# Patient Record
Sex: Female | Born: 1970 | Race: White | Hispanic: Yes | Marital: Married | Smoking: Current every day smoker
Health system: Southern US, Community
[De-identification: ages and names within clinical notes are randomized; demographics above are authoritative.]

## PROBLEM LIST (undated history)

## (undated) DIAGNOSIS — J45909 Unspecified asthma, uncomplicated: Secondary | ICD-10-CM

## (undated) DIAGNOSIS — Z91030 Bee allergy status: Secondary | ICD-10-CM

## (undated) DIAGNOSIS — J329 Chronic sinusitis, unspecified: Secondary | ICD-10-CM

## (undated) DIAGNOSIS — J309 Allergic rhinitis, unspecified: Secondary | ICD-10-CM

## (undated) DIAGNOSIS — D509 Iron deficiency anemia, unspecified: Secondary | ICD-10-CM

## (undated) DIAGNOSIS — F329 Major depressive disorder, single episode, unspecified: Secondary | ICD-10-CM

## (undated) HISTORY — DX: Chronic sinusitis, unspecified: J32.9

## (undated) HISTORY — DX: Major depressive disorder, single episode, unspecified: F32.9

## (undated) HISTORY — PX: UMBILICAL HERNIA REPAIR: SHX196

## (undated) HISTORY — DX: Unspecified asthma, uncomplicated: J45.909

## (undated) HISTORY — DX: Iron deficiency anemia, unspecified: D50.9

## (undated) HISTORY — DX: Bee allergy status: Z91.030

## (undated) HISTORY — DX: Allergic rhinitis, unspecified: J30.9

---

## 2003-11-27 ENCOUNTER — Ambulatory Visit: Payer: Self-pay | Admitting: Internal Medicine

## 2004-01-31 ENCOUNTER — Ambulatory Visit: Payer: Self-pay | Admitting: Internal Medicine

## 2004-01-31 ENCOUNTER — Emergency Department (HOSPITAL_COMMUNITY): Admission: EM | Admit: 2004-01-31 | Discharge: 2004-01-31 | Payer: Self-pay | Admitting: Emergency Medicine

## 2004-04-02 ENCOUNTER — Ambulatory Visit (HOSPITAL_COMMUNITY): Admission: RE | Admit: 2004-04-02 | Discharge: 2004-04-02 | Payer: Self-pay | Admitting: Surgery

## 2004-04-02 ENCOUNTER — Encounter (INDEPENDENT_AMBULATORY_CARE_PROVIDER_SITE_OTHER): Payer: Self-pay | Admitting: *Deleted

## 2004-04-02 ENCOUNTER — Ambulatory Visit (HOSPITAL_BASED_OUTPATIENT_CLINIC_OR_DEPARTMENT_OTHER): Admission: RE | Admit: 2004-04-02 | Discharge: 2004-04-02 | Payer: Self-pay | Admitting: Surgery

## 2004-06-05 ENCOUNTER — Ambulatory Visit: Payer: Self-pay | Admitting: Internal Medicine

## 2004-08-18 ENCOUNTER — Ambulatory Visit: Payer: Self-pay | Admitting: Internal Medicine

## 2005-02-10 ENCOUNTER — Other Ambulatory Visit: Admission: RE | Admit: 2005-02-10 | Discharge: 2005-02-10 | Payer: Self-pay | Admitting: Obstetrics & Gynecology

## 2005-03-18 ENCOUNTER — Ambulatory Visit (HOSPITAL_COMMUNITY): Admission: RE | Admit: 2005-03-18 | Discharge: 2005-03-18 | Payer: Self-pay | Admitting: Obstetrics & Gynecology

## 2005-03-18 ENCOUNTER — Encounter (INDEPENDENT_AMBULATORY_CARE_PROVIDER_SITE_OTHER): Payer: Self-pay | Admitting: Specialist

## 2005-07-01 ENCOUNTER — Encounter: Admission: RE | Admit: 2005-07-01 | Discharge: 2005-07-01 | Payer: Self-pay | Admitting: Obstetrics & Gynecology

## 2005-07-02 ENCOUNTER — Encounter: Admission: RE | Admit: 2005-07-02 | Discharge: 2005-07-02 | Payer: Self-pay | Admitting: Obstetrics & Gynecology

## 2006-01-19 ENCOUNTER — Ambulatory Visit: Payer: Self-pay | Admitting: Internal Medicine

## 2006-05-20 ENCOUNTER — Ambulatory Visit: Payer: Self-pay | Admitting: Internal Medicine

## 2006-07-26 ENCOUNTER — Ambulatory Visit: Payer: Self-pay | Admitting: Internal Medicine

## 2006-11-07 ENCOUNTER — Encounter: Payer: Self-pay | Admitting: Internal Medicine

## 2006-11-07 DIAGNOSIS — J309 Allergic rhinitis, unspecified: Secondary | ICD-10-CM | POA: Insufficient documentation

## 2006-11-07 DIAGNOSIS — H0019 Chalazion unspecified eye, unspecified eyelid: Secondary | ICD-10-CM | POA: Insufficient documentation

## 2006-11-07 DIAGNOSIS — L723 Sebaceous cyst: Secondary | ICD-10-CM | POA: Insufficient documentation

## 2006-12-27 ENCOUNTER — Ambulatory Visit: Payer: Self-pay | Admitting: Internal Medicine

## 2006-12-27 DIAGNOSIS — F4321 Adjustment disorder with depressed mood: Secondary | ICD-10-CM | POA: Insufficient documentation

## 2006-12-27 DIAGNOSIS — J45909 Unspecified asthma, uncomplicated: Secondary | ICD-10-CM | POA: Insufficient documentation

## 2006-12-27 DIAGNOSIS — F3289 Other specified depressive episodes: Secondary | ICD-10-CM

## 2006-12-27 DIAGNOSIS — F329 Major depressive disorder, single episode, unspecified: Secondary | ICD-10-CM

## 2006-12-27 DIAGNOSIS — D509 Iron deficiency anemia, unspecified: Secondary | ICD-10-CM

## 2006-12-27 DIAGNOSIS — J019 Acute sinusitis, unspecified: Secondary | ICD-10-CM | POA: Insufficient documentation

## 2006-12-27 HISTORY — DX: Other specified depressive episodes: F32.89

## 2006-12-27 HISTORY — DX: Unspecified asthma, uncomplicated: J45.909

## 2006-12-27 HISTORY — DX: Major depressive disorder, single episode, unspecified: F32.9

## 2006-12-27 HISTORY — DX: Iron deficiency anemia, unspecified: D50.9

## 2007-06-29 ENCOUNTER — Ambulatory Visit: Payer: Self-pay | Admitting: Internal Medicine

## 2007-06-29 DIAGNOSIS — F17200 Nicotine dependence, unspecified, uncomplicated: Secondary | ICD-10-CM | POA: Insufficient documentation

## 2007-06-29 DIAGNOSIS — J069 Acute upper respiratory infection, unspecified: Secondary | ICD-10-CM | POA: Insufficient documentation

## 2007-06-29 LAB — CONVERTED CEMR LAB: Rapid Strep: NEGATIVE

## 2007-08-30 ENCOUNTER — Ambulatory Visit: Payer: Self-pay | Admitting: Internal Medicine

## 2007-08-30 DIAGNOSIS — R42 Dizziness and giddiness: Secondary | ICD-10-CM | POA: Insufficient documentation

## 2007-08-30 DIAGNOSIS — G4700 Insomnia, unspecified: Secondary | ICD-10-CM | POA: Insufficient documentation

## 2007-09-14 ENCOUNTER — Encounter: Payer: Self-pay | Admitting: Internal Medicine

## 2007-09-22 ENCOUNTER — Telehealth (INDEPENDENT_AMBULATORY_CARE_PROVIDER_SITE_OTHER): Payer: Self-pay | Admitting: *Deleted

## 2007-10-25 ENCOUNTER — Telehealth: Payer: Self-pay | Admitting: Internal Medicine

## 2008-07-26 ENCOUNTER — Ambulatory Visit: Payer: Self-pay | Admitting: Internal Medicine

## 2008-07-30 ENCOUNTER — Encounter (INDEPENDENT_AMBULATORY_CARE_PROVIDER_SITE_OTHER): Payer: Self-pay | Admitting: *Deleted

## 2008-07-30 ENCOUNTER — Telehealth: Payer: Self-pay | Admitting: Internal Medicine

## 2008-08-14 ENCOUNTER — Telehealth: Payer: Self-pay | Admitting: Internal Medicine

## 2008-08-15 ENCOUNTER — Encounter: Payer: Self-pay | Admitting: Internal Medicine

## 2008-09-09 ENCOUNTER — Telehealth: Payer: Self-pay | Admitting: Internal Medicine

## 2008-09-11 ENCOUNTER — Ambulatory Visit: Payer: Self-pay | Admitting: Internal Medicine

## 2008-09-11 DIAGNOSIS — H6690 Otitis media, unspecified, unspecified ear: Secondary | ICD-10-CM | POA: Insufficient documentation

## 2008-09-11 DIAGNOSIS — J329 Chronic sinusitis, unspecified: Secondary | ICD-10-CM

## 2008-09-11 HISTORY — DX: Chronic sinusitis, unspecified: J32.9

## 2008-11-19 ENCOUNTER — Ambulatory Visit: Payer: Self-pay | Admitting: Internal Medicine

## 2008-11-19 DIAGNOSIS — K921 Melena: Secondary | ICD-10-CM | POA: Insufficient documentation

## 2008-11-21 ENCOUNTER — Encounter: Payer: Self-pay | Admitting: Internal Medicine

## 2008-11-21 LAB — CONVERTED CEMR LAB
AST: 53 units/L — ABNORMAL HIGH (ref 0–37)
Albumin: 4.1 g/dL (ref 3.5–5.2)
Alkaline Phosphatase: 57 units/L (ref 39–117)
Basophils Absolute: 0.1 10*3/uL (ref 0.0–0.1)
CO2: 25 meq/L (ref 19–32)
Calcium: 8.6 mg/dL (ref 8.4–10.5)
Cholesterol: 275 mg/dL — ABNORMAL HIGH (ref 0–200)
Creatinine, Ser: 0.8 mg/dL (ref 0.4–1.2)
Direct LDL: 169.8 mg/dL
GFR calc non Af Amer: 84.41 mL/min (ref >60)
HCT: 39.4 % (ref 36.0–46.0)
Lymphocytes Relative: 29.9 % (ref 12.0–46.0)
Lymphs Abs: 1.6 10*3/uL (ref 0.7–4.0)
Monocytes Absolute: 0.4 10*3/uL (ref 0.1–1.0)
Neutrophils Relative %: 58.4 % (ref 43.0–77.0)
Potassium: 4.2 meq/L (ref 3.5–5.1)
RDW: 13.2 % (ref 11.5–14.6)
Sodium: 140 meq/L (ref 135–145)
Total Bilirubin: 0.4 mg/dL (ref 0.3–1.2)
Total CHOL/HDL Ratio: 3

## 2008-11-22 LAB — CONVERTED CEMR LAB
HCV Ab: NEGATIVE
Hep A IgM: NEGATIVE
Hep B C IgM: NEGATIVE
Hepatitis B Surface Ag: NEGATIVE

## 2009-03-20 ENCOUNTER — Encounter: Payer: Self-pay | Admitting: Internal Medicine

## 2010-02-17 NOTE — Letter (Signed)
Summary: Referral - not able to see patient  [facility]  [address] [place], [place] [address]   Phone: [phone]  Fax: [phone]    March 20, 2009   Dr. Oliver Barre, M.D. [address] [place], [place] [address]   Re:   Shannon [name] DOB:  1970/03/22 MRN:   [id number]    Dear Dr. Jonny Ruiz:  Thank you for your kind referral of the above patient.  We have attempted to schedule the recommended procedure Screening Colonoscopy but have not been able to schedule because:   X  The patient was not available by phone and/or has not returned our calls.  ___ The patient declined to schedule the procedure at this time.  We appreciate the referral and hope that we will have the opportunity to treat this patient in the future.    Sincerely,    [facility] [phone]

## 2010-06-05 NOTE — Consult Note (Signed)
[name], [name]              ACCOUNT NO.:  [id number]   MEDICAL RECORD NO.:  [id number]          PATIENT TYPE:  EMS   LOCATION:  ED                           FACILITY[facility]  [facility]   PHYSICIAN:  Sandria Bales. Ezzard Standing, M.D.  DATE OF BIRTH:  11-29-1970   DATE OF CONSULTATION:  DATE OF DISCHARGE:                                   CONSULTATION   [name] is a 40 year old black female who is a patient of Dr. Oliver [name]  who has noticed a lump at the bottom of her neck for about a couple of  years; however, over the last 4-5 days, this lump has increased in size with  increasing tenderness.  She saw Dr. Jonny [name] in his office today, who felt this  was infected, put her on Avelox and asked me to see her.   Patient has no history of skin conditions or skin problems.   PAST MEDICAL HISTORY:  She has no allergies.  Is on no medications.  She had  a tetanus shot within the last five years.  She denies any history of  pulmonary disease.   REVIEW OF SYSTEMS:  PULMONARY:  Does not smoke cigarettes.  CARDIAC:  No heart disease or chest pain.  GASTROINTESTINAL:  No history of peptic ulcer disease or liver disease.  UROLOGIC:  No kidney stones or kidney infections.  She is accompanied by her  boyfriend, Shannon [name], in the emergency room.   PHYSICAL EXAMINATION:  VITAL SIGNS:  Temperature 96.7, blood pressure  137/89, pulse 82, respirations 20.  GENERAL:  She is a pleasant black female.  HEENT:  Unremarkable.  NECK:  At the base of her neck, she has about a 2 cm infected cyst.  This is  right at the suprasternal notch.  She has no lymphadenopathy.  I really  cannot palpate her thyroid very well.  LUNGS:  Clear to auscultation.  HEART:  Regular rate and rhythm without murmur or rub.   IMPRESSION:  Diagnosis:   Infected sebaceous cyst at the base of her neck.   I discussed with the patient that I think the best way for me to take care  of this is for me to lance this today and then bring her back in 6-8  weeks  to excise the residual sac.   Will plan to do that today.  She already has Avelox as an antibiotic, which  she will carry out for at least 7 days.  I will give her a V-pack, or  Vicodin pack, when she leaves the emergency room.  She will see me back in  one week after the drainage.     Davi   DHN/MEDQ  D:  01/31/2004  T:  01/31/2004  Job:  [id number]   cc:   Corwin Levins, M.D. [facility]

## 2010-06-05 NOTE — Op Note (Signed)
NAMEJESELLE, HISER              ACCOUNT NO.:  [id number]   MEDICAL RECORD NO.:  [id number]          PATIENT TYPE:  AMB   LOCATION:  SDC                           FACILITY:  [facility]   PHYSICIAN:  Freddy Finner, M.D.   DATE OF BIRTH:  12/10/1970   DATE OF PROCEDURE:  03/18/2005  DATE OF DISCHARGE:                                 OPERATIVE REPORT   PREOPERATIVE DIAGNOSES:  1.  Fibroma of right ovary with calcification, endometrial thickening.  2.  History of menorrhagia, heavy regular menses of nine to 11 days' flow      duration.   POSTOPERATIVE DIAGNOSES:  1.  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  2.  Possible small fibroma of superior pole of right ovary.  3.  Two small fibromas of left ovary associated with the utero-ovarian      ligament.  4.  Endometrial thickening.  5.  Endocervical polyp.  6.  Intra-abdominal adhesions.   OPERATIVE PROCEDURES:  1.  Laparoscopy.  2.  Fulguration and an excision  of small fibromas of left utero-ovarian      ligament and right superior pole of ovary.  3.  Fulguration and resection of a 1.5-cm fibroma of right fundus.  4.  Hysteroscopy.  5.  D&C.  6.  Resection of endocervical polyp.   ESTIMATED INTRAOPERATIVE BLOOD LOSS:  Less than or equal to 50 cc.   INTRAOPERATIVE COMPLICATIONS:  None.   The patient is a 40 year old with numerous complaints related to her menses  on presentation to see my nurse practitioner, Julio Sicks in the very  recent past.  A CA-125 was obtained, after finding of a solid mass  associated with the right ovary on ultrasound on February 10, 2005.  The CA-  125 was normal.  The patient had significant anxiety about the presence of  the fibroma.  Because of this and somewhat thickening of the endometrium and  menorrhagia, it was elected to perform a hysteroscopy and D&C at the same  time.  She is admitted at this time for that purpose.   She was admitted on the afternoon of surgery.  She was brought to the  operating room, after receiving a gram of Rocephin IV.  She was placed under  general anesthesia, placed in the dorsal lithotomy position using the [employer] system.  Betadine prep of abdomen, perineum, and vagina was carried  out in the usual fashion.  Hulka tenaculum was attached to the cervix under  direct visualization.  Bladder was evacuated with a Robinson catheter using  sterile technique.  Sterile drapes were applied.  Two small incisions were  made, one just at the umbilicus and another just above the symphysis in the  midline.  An 11-mm non-biter  disposable trocar was introduced at the  umbilicus while elevating the anterior abdominal wall manually.  Direct  inspection revealed adequate placement with no evidence of injury on entry.  Pneumoperitoneum was not allowed to accumulate using carbon dioxide gas.  Systematic examination of the pelvic and abdominal contents was carried out.  Photographs of some of the findings  are made and retained in the office  record.  These findings include those things noted as postoperative  diagnoses and those findings are recorded in the still photographs.  The  appendix was visualized and was normal.  There is no apparent abnormality in  the upper abdomen.  There were some filmy adhesions on omentum to lower  abdominal peritoneum inferior to the umbilicus.  Using the grasping forceps  through a second trocar placed through the lower incision in the jaws of  tripolar device, the fibromas of the ovaries were fulgurated and removed.  The fibroid at the fundus on the right side was excised and retrieved  through the trocar sleeve with a grasping forceps through the operating  channel of the laparoscope.  These are submitted for histologic examination.  Hemostasis was noted to be complete.  All gas was allowed to escape from the  abdomen.  The incisions in the umbilicus were closed with interrupted  subcuticular sutures, 3-0 Dexon, 0.25% plain  Marcaine was injected into the  incision sites for postoperative analgesia.   Attention was then turned vaginally.  The Hulka tenaculum was removed.  The  cervix was grasped with the anterior lip of a single tooth tenaculum.  The  uterus sounded to 8.5-cm.  The cervix was progressively dilated with Pratts  to #25.  A 12.5 degree [facility] hysteroscope was introduced and the distending  medium was  3% sorbitol.  The cavity was inspected and photographed.  There  was a polyp in the lower uterine segment or in the endocervix which was  where it seemed to be most easily visualized.  Gentle thorough curettage was  carried out with contra motion.  A complete sampling of the endometrium and  resection of the polyp.  The procedure at this point was terminated.  All  instruments were removed.  The patient was awakened and taken to the  recovery room in good condition.   She will be given routine outpatient surgical instructions for followup in  the office in two weeks.   She is to have Vicodin as needed for postoperative pain unrelieved by  Tylenol or ibuprofen.      Freddy Finner, M.D.  Electronically Signed     WRN/MEDQ  D:  03/19/2005  T:  03/19/2005  Job:  [id number]

## 2010-06-05 NOTE — Op Note (Signed)
NAMEBRAILEY, BUESCHER              ACCOUNT NO.:  [id number]   MEDICAL RECORD NO.:  [id number]          PATIENT TYPE:  AMB   LOCATION:  DSC                          FACILITY:  [facility]   PHYSICIAN:  Sandria Bales. Ezzard Standing, M.D.  DATE OF BIRTH:  September 04, 1970   DATE OF PROCEDURE:  04/02/2004  DATE OF DISCHARGE:                                 OPERATIVE REPORT   PREOPERATIVE DIAGNOSIS:  Residual sac from infected cyst at base of neck.   POSTOPERATIVE DIAGNOSIS:  A 1.5 cm residual sac at the base of neck.   PROCEDURE:  Excision of residual sac.   SURGEON:  Sandria Bales. Ezzard Standing, M.D.   ANESTHESIA:  Approximately 8 mL of 1% Xylocaine.   COMPLICATIONS:  None.   INDICATIONS FOR PROCEDURE:  [name] is a 40 year old black female who  had an infected cyst at the base of her neck drained on the 13th of January,  2006.  She now comes for excision of that residual infected sac or cyst.    Operative Note:  Patient in a supine position, her neck prepped with Betadine solution, the  skin was infiltrated with about 8 mL of 1% Xylocaine.  I made an elliptical  incision excising about a 1.5 cm sac right at her suprasternal notch at the  base of her neck.   I then sent this to pathology.  I closed the wound with interrupted and then  running 5-0 Vicryl suture, painted with Tincture of Benzoin and Steri-  stripped it.  She tolerated the procedure well and was transported to the  recovery room in good condition.  SHe will see me back in approximately 2  weeks.      DHN/MEDQ  D:  04/02/2004  T:  04/02/2004  Job:  [id number]   cc:   Corwin Levins, M.D. [facility]

## 2010-06-05 NOTE — Op Note (Signed)
[name], [name]              ACCOUNT NO.:  [id number]   MEDICAL RECORD NO.:  [id number]          PATIENT TYPE:  EMS   LOCATION:  ED                           FACILITY:  [facility]   PHYSICIAN:  Sandria Bales. Ezzard Standing, M.D.  DATE OF BIRTH:  1970-09-16   DATE OF PROCEDURE:  01/31/2004  DATE OF DISCHARGE:                                 OPERATIVE REPORT   PREOPERATIVE DIAGNOSES:  Infected sebaceous base at the suprasternal notch  approximately 2 cm in size.   POSTOPERATIVE DIAGNOSES:  Sebaceous cyst 2 cm in size at the suprasternal  notch.   SURGEON:  Sandria Bales. Ezzard Standing, M.D.   ANESTHESIA:  6 mL of 1% Xylocaine.   COMPLICATIONS:  None.   INDICATIONS FOR PROCEDURE:  [name] is a 40 year old black female who  saw Dr. Oliver Barre with an infected sebaceous cyst at her suprasternal notch  and now comes to the emergency room for incision and drainage of this.   Operative procedure:   The patient in supine position, her neck prepped with Betadine solution,  skin around it infiltrated with about 6 mL of 1% Xylocaine.  I then did a  linear incision into the sebaceous cyst and drained it out and cleaned it  with a Q-tip.  I then packed it with gauze and sterilely dressed it.   She will start showers tomorrow. She is going to continue the Avelox for at  least one week. She has Vicodin for pain and will see me back in 7-10 days  for followup.     Davi   DHN/MEDQ  D:  01/31/2004  T:  01/31/2004  Job:  [id number]   cc:   Corwin Levins, M.D. [facility]

## 2012-11-24 ENCOUNTER — Ambulatory Visit: Payer: Self-pay | Admitting: Internal Medicine

## 2012-11-24 DIAGNOSIS — Z0289 Encounter for other administrative examinations: Secondary | ICD-10-CM

## 2012-11-30 ENCOUNTER — Ambulatory Visit: Payer: Self-pay | Admitting: Internal Medicine

## 2015-05-22 ENCOUNTER — Ambulatory Visit: Payer: Self-pay | Admitting: Family

## 2015-09-16 ENCOUNTER — Telehealth: Payer: Self-pay | Admitting: General Practice

## 2015-09-16 NOTE — Telephone Encounter (Signed)
Patient is requesting no show fee to be waived from  5/4.  Patient states she was out of town.

## 2015-10-08 NOTE — Telephone Encounter (Signed)
Please inform patient I have emailed billing to void the No Show fee for [facility] 05/22/15 as a one time courtesy.

## 2015-10-09 NOTE — Telephone Encounter (Signed)
Notified patient.

## 2016-11-01 ENCOUNTER — Other Ambulatory Visit (INDEPENDENT_AMBULATORY_CARE_PROVIDER_SITE_OTHER): Payer: Federal, State, Local not specified - PPO

## 2016-11-01 ENCOUNTER — Encounter: Payer: Self-pay | Admitting: Internal Medicine

## 2016-11-01 ENCOUNTER — Ambulatory Visit (INDEPENDENT_AMBULATORY_CARE_PROVIDER_SITE_OTHER): Payer: Federal, State, Local not specified - PPO | Admitting: Internal Medicine

## 2016-11-01 VITALS — BP 136/84 | HR 98 | Temp 98.8°F | Ht 64.0 in | Wt 113.0 lb

## 2016-11-01 DIAGNOSIS — R945 Abnormal results of liver function studies: Secondary | ICD-10-CM

## 2016-11-01 DIAGNOSIS — E785 Hyperlipidemia, unspecified: Secondary | ICD-10-CM | POA: Diagnosis not present

## 2016-11-01 DIAGNOSIS — Z23 Encounter for immunization: Secondary | ICD-10-CM | POA: Diagnosis not present

## 2016-11-01 DIAGNOSIS — Z0001 Encounter for general adult medical examination with abnormal findings: Secondary | ICD-10-CM | POA: Diagnosis not present

## 2016-11-01 DIAGNOSIS — Z800 Family history of malignant neoplasm of digestive organs: Secondary | ICD-10-CM | POA: Insufficient documentation

## 2016-11-01 DIAGNOSIS — F17200 Nicotine dependence, unspecified, uncomplicated: Secondary | ICD-10-CM | POA: Diagnosis not present

## 2016-11-01 DIAGNOSIS — Z114 Encounter for screening for human immunodeficiency virus [HIV]: Secondary | ICD-10-CM

## 2016-11-01 DIAGNOSIS — R6882 Decreased libido: Secondary | ICD-10-CM | POA: Insufficient documentation

## 2016-11-01 DIAGNOSIS — R7989 Other specified abnormal findings of blood chemistry: Secondary | ICD-10-CM | POA: Insufficient documentation

## 2016-11-01 DIAGNOSIS — Z91030 Bee allergy status: Secondary | ICD-10-CM | POA: Insufficient documentation

## 2016-11-01 DIAGNOSIS — J309 Allergic rhinitis, unspecified: Secondary | ICD-10-CM | POA: Insufficient documentation

## 2016-11-01 LAB — CBC WITH DIFFERENTIAL/PLATELET
BASOS ABS: 0.2 10*3/uL — AB (ref 0.0–0.1)
Basophils Relative: 2.1 % (ref 0.0–3.0)
EOS PCT: 1.9 % (ref 0.0–5.0)
Eosinophils Absolute: 0.1 10*3/uL (ref 0.0–0.7)
HCT: 35.5 % — ABNORMAL LOW (ref 36.0–46.0)
Hemoglobin: 12 g/dL (ref 12.0–15.0)
LYMPHS ABS: 2.3 10*3/uL (ref 0.7–4.0)
Lymphocytes Relative: 29.8 % (ref 12.0–46.0)
MCHC: 33.9 g/dL (ref 30.0–36.0)
MCV: 103.4 fl — AB (ref 78.0–100.0)
MONO ABS: 0.8 10*3/uL (ref 0.1–1.0)
MONOS PCT: 10.3 % (ref 3.0–12.0)
NEUTROS ABS: 4.3 10*3/uL (ref 1.4–7.7)
NEUTROS PCT: 55.9 % (ref 43.0–77.0)
PLATELETS: 343 10*3/uL (ref 150.0–400.0)
RBC: 3.44 Mil/uL — ABNORMAL LOW (ref 3.87–5.11)
RDW: 14.4 % (ref 11.5–15.5)
WBC: 7.7 10*3/uL (ref 4.0–10.5)

## 2016-11-01 LAB — BASIC METABOLIC PANEL
BUN: 2 mg/dL — AB (ref 6–23)
CALCIUM: 8.9 mg/dL (ref 8.4–10.5)
CO2: 23 meq/L (ref 19–32)
Chloride: 107 mEq/L (ref 96–112)
Creatinine, Ser: 0.57 mg/dL (ref 0.40–1.20)
GFR: 120.31 mL/min (ref >60.00)
GLUCOSE: 67 mg/dL — AB (ref 70–99)
POTASSIUM: 4.2 meq/L (ref 3.5–5.1)
SODIUM: 136 meq/L (ref 135–145)

## 2016-11-01 LAB — HEPATIC FUNCTION PANEL
ALT: 19 U/L (ref 0–35)
AST: 27 U/L (ref 0–37)
Albumin: 3.5 g/dL (ref 3.5–5.2)
Alkaline Phosphatase: 60 U/L (ref 39–117)
BILIRUBIN TOTAL: 0.3 mg/dL (ref 0.2–1.2)
Bilirubin, Direct: 0.1 mg/dL (ref 0.0–0.3)
TOTAL PROTEIN: 6.5 g/dL (ref 6.0–8.3)

## 2016-11-01 LAB — LIPID PANEL
CHOLESTEROL: 184 mg/dL (ref 0–200)
HDL: 90.6 mg/dL (ref >39.00)
LDL CALC: 73 mg/dL (ref 0–99)
NonHDL: 93.27
TRIGLYCERIDES: 99 mg/dL (ref 0.0–149.0)
Total CHOL/HDL Ratio: 2
VLDL: 19.8 mg/dL (ref 0.0–40.0)

## 2016-11-01 LAB — TSH: TSH: 2.21 u[IU]/mL (ref 0.35–4.50)

## 2016-11-01 LAB — URINALYSIS, ROUTINE W REFLEX MICROSCOPIC
Bilirubin Urine: NEGATIVE
Hgb urine dipstick: NEGATIVE
Ketones, ur: NEGATIVE
Nitrite: NEGATIVE
PH: 5.5 (ref 5.0–8.0)
RBC / HPF: NONE SEEN (ref 0–?)
TOTAL PROTEIN, URINE-UPE24: NEGATIVE
URINE GLUCOSE: NEGATIVE
Urobilinogen, UA: 0.2 (ref 0.0–1.0)

## 2016-11-01 MED ORDER — VARENICLINE TARTRATE 0.5 MG X 11 & 1 MG X 42 PO MISC: ORAL | 0 refills | Status: DC

## 2016-11-01 MED ORDER — VARENICLINE TARTRATE 1 MG PO TABS: 1.0000 mg | ORAL_TABLET | Freq: Two times a day (BID) | ORAL | 1 refills | Status: DC

## 2016-11-01 NOTE — Patient Instructions (Addendum)
You had the flu shot today, and Tdap tetanus shot  Please remember to make your yearly GYN exam, and consider repeat Bone Density and hormone therapy after menopause and low libido  Please take all new medication as prescribed - the Chantix  Please continue all other medications as before, and refills have been done if requested.  Please have the pharmacy call with any other refills you may need.  Please continue your efforts at being more active, low cholesterol diet, and weight control.  You are otherwise up to date with prevention measures today.  Please keep your appointments with your specialists as you may have planned  We may need an ultrasound if the liver tests are elevated  We may need to start a cholesterol medication if the LDL is still high  Please go to the LAB in the Basement (turn left off the elevator) for the tests to be done today  You will be contacted by phone if any changes need to be made immediately.  Otherwise, you will receive a letter about your results with an explanation, but please check with MyChart first.  Please remember to sign up for MyChart if you have not done so, as this will be important to you in the future with finding out test results, communicating by private email, and scheduling acute appointments online when needed.  Please return in 1 year for your yearly visit, or sooner if needed, with Lab testing done 3-5 days before

## 2016-11-01 NOTE — Assessment & Plan Note (Signed)
D/w pt need for quitting smoking, ok for chantix asd,  to f/u any worsening symptoms or concerns

## 2016-11-01 NOTE — Assessment & Plan Note (Signed)
Due for colonoscopy - will refer 

## 2016-11-01 NOTE — Assessment & Plan Note (Signed)

## 2016-11-01 NOTE — Assessment & Plan Note (Addendum)
Very mild previous, asympt, hep c neg in 2010, for f/u lft's and consider abd u/s  In addition to the time spent performing CPE, I spent an additional 15 minutes face to face,in which greater than 50% of this time was spent in counseling and coordination of care for patient's illness as documented, including abnormal liver function testing, smoking cessation and appropriateness for chantix, HLD, low libido and FH colon ca with need for colonoscopy

## 2016-11-01 NOTE — Assessment & Plan Note (Signed)
Previous mod to severe, has had improved diet, for f/u lab today

## 2016-11-01 NOTE — Progress Notes (Signed)
Subjective:    Patient ID: Shannon [name], female    DOB: Jul 17, 1970, 46 y.o.   MRN: [id number]  HPI  Here for wellness and reestablish after lost to f/u since 2010-;  Overall doing ok;  Pt denies Chest pain, worsening SOB, DOE, wheezing, orthopnea, PND, worsening LE edema, palpitations, dizziness or syncope.  Pt denies neurological change such as new headache, facial or extremity weakness.  Pt denies polydipsia, polyuria, or low sugar symptoms. Pt states overall good compliance with treatment and medications, good tolerability, and has been trying to follow appropriate diet.  Pt denies worsening depressive symptoms, suicidal ideation or panic. No fever, night sweats, wt loss, loss of appetite, or other constitutional symptoms.  Pt states good ability with ADL's, has low fall risk, home safety reviewed and adequate, no other significant changes in hearing or vision, and only occasionally active with exercise. Now post menopausal after hot flashes and no menses x 2 yrs.  Not working after she lost her job 2009 with situational depression episode with mothers death. Has lost libido since menopause, and couple states no intercourse for that time but apparently not a problem.  She asks for chantix as she is now committed to quitting smoking; chantix did help years ago, but fell back into smoking after mothers death.  Denies worsening reflux, abd pain, dysphagia, n/v, bowel change or blood.  Father has died with colon cancer and she is due for colon screening Past Medical History:  Diagnosis Date  . Allergic rhinitis   . ANEMIA-IRON DEFICIENCY 12/27/2006   Qualifier: Diagnosis of  By: Jenny Reichmann MD, [place] 12/27/2006   Qualifier: Diagnosis of  By: Jenny Reichmann MD, [place] sting allergy   . Depressive disorder, not elsewhere classified 12/27/2006   Centricity Description: DEPRESSION Qualifier: Diagnosis of  By: Jenny Reichmann MD, [place]  Centricity Description: DEPRESSIVE DISORDER Qualifier: Diagnosis of  By:  Jenny Reichmann MD, [place]   . SINUSITIS, CHRONIC 09/11/2008   Qualifier: Diagnosis of  By: Jenny Reichmann MD, [place]    History reviewed. No pertinent surgical history.  reports that she has been smoking.  She has never used smokeless tobacco. She reports that she drinks alcohol. She reports that she does not use drugs. family history includes Cancer - Colon in her father; Cancer - Lung in her mother. Allergies  Allergen Reactions  . Bee Venom    No current outpatient prescriptions on file prior to visit.   No current facility-administered medications on file prior to visit.    Review of Systems Constitutional: Negative for other unusual diaphoresis, sweats, appetite or weight changes HENT: Negative for other worsening hearing loss, ear pain, facial swelling, mouth sores or neck stiffness.   Eyes: Negative for other worsening pain, redness or other visual disturbance.  Respiratory: Negative for other stridor or swelling Cardiovascular: Negative for other palpitations or other chest pain  Gastrointestinal: Negative for worsening diarrhea or loose stools, blood in stool, distention or other pain Genitourinary: Negative for hematuria, flank pain or other change in urine volume.  Musculoskeletal: Negative for myalgias or other joint swelling.  Skin: Negative for other color change, or other wound or worsening drainage.  Neurological: Negative for other syncope or numbness. Hematological: Negative for other adenopathy or swelling Psychiatric/Behavioral: Negative for hallucinations, other worsening agitation, SI, self-injury, or new decreased concentration All other system neg per pt    Objective:   Physical Exam BP 136/84   Pulse 98  Temp 98.8 F (37.1 C) (Oral)   Ht 5\' 4"  (1.626 m)   Wt 113 lb (51.3 kg)   SpO2 100%   BMI 19.40 kg/m  VS noted,  Constitutional: Pt is oriented to person, place, and time. Appears well-developed and well-nourished, in no significant distress and comfortable Head:  Normocephalic and atraumatic  Eyes: Conjunctivae and EOM are normal. Pupils are equal, round, and reactive to light Right Ear: External ear normal without discharge Left Ear: External ear normal without discharge Nose: Nose without discharge or deformity Mouth/Throat: Oropharynx is without other ulcerations and moist  Neck: Normal range of motion. Neck supple. No JVD present. No tracheal deviation present or significant neck LA or mass Cardiovascular: Normal rate, regular rhythm, normal heart sounds and intact distal pulses.   Pulmonary/Chest: WOB normal and breath sounds without rales or wheezing  Abdominal: Soft. Bowel sounds are normal. NT. No HSM  Musculoskeletal: Normal range of motion. Exhibits no edema Lymphadenopathy: Has no other cervical adenopathy.  Neurological: Pt is alert and oriented to person, place, and time. Pt has normal reflexes. No cranial nerve deficit. Motor grossly intact, Gait intact Skin: Skin is warm and dry. No rash noted or new ulcerations Psychiatric:  Has nervous mood and affect. Behavior is normal without agitation Lab Results  Component Value Date   WBC 7.7 11/01/2016   HGB 12.0 11/01/2016   HCT 35.5 (L) 11/01/2016   PLT 343.0 11/01/2016   GLUCOSE 67 (L) 11/01/2016   CHOL 184 11/01/2016   TRIG 99.0 11/01/2016   HDL 90.60 11/01/2016   LDLDIRECT 169.8 11/19/2008   LDLCALC 73 11/01/2016   ALT 19 11/01/2016   AST 27 11/01/2016   NA 136 11/01/2016   K 4.2 11/01/2016   CL 107 11/01/2016   CREATININE 0.57 11/01/2016   BUN 2 (L) 11/01/2016   CO2 23 11/01/2016   TSH 2.21 11/01/2016   INR 1.0 ratio 11/19/2008         Assessment & Plan:

## 2016-11-01 NOTE — Assessment & Plan Note (Signed)
D/w pt and husband, would encourage routine f/u with GYN, consider estrace for post menopausal

## 2016-11-02 ENCOUNTER — Encounter: Payer: Self-pay | Admitting: Gastroenterology

## 2016-11-02 LAB — HIV ANTIBODY (ROUTINE TESTING W REFLEX): HIV: NONREACTIVE

## 2016-12-27 ENCOUNTER — Ambulatory Visit: Payer: Federal, State, Local not specified - PPO | Admitting: Gastroenterology

## 2017-02-15 ENCOUNTER — Encounter: Payer: Self-pay | Admitting: Gastroenterology

## 2017-02-15 ENCOUNTER — Ambulatory Visit: Payer: Federal, State, Local not specified - PPO | Admitting: Gastroenterology

## 2017-02-15 VITALS — BP 118/74 | HR 68 | Ht 61.0 in | Wt 110.0 lb

## 2017-02-15 DIAGNOSIS — Z800 Family history of malignant neoplasm of digestive organs: Secondary | ICD-10-CM

## 2017-02-15 DIAGNOSIS — K625 Hemorrhage of anus and rectum: Secondary | ICD-10-CM | POA: Diagnosis not present

## 2017-02-15 DIAGNOSIS — K5900 Constipation, unspecified: Secondary | ICD-10-CM

## 2017-02-15 MED ORDER — PEG-KCL-NACL-NASULF-NA ASC-C 140 G PO SOLR: 1.0000 | Freq: Once | ORAL | 0 refills | Status: AC

## 2017-02-15 NOTE — Patient Instructions (Addendum)
Constipation:    Increase your intake of water, fruits/vegetables and your activity level.  Ducosate stool softener, 100 mg twice daily. If not improved in 1 week after starting that, please add:  Miralax powder  1 capful in a glass of water or juice once daily.  If you are age 49 or older, your body mass index should be between 23-30. Your Body mass index is 20.78 kg/m. If this is out of the aforementioned range listed, please consider follow up with your Primary Care Provider.  If you are age 16 or younger, your body mass index should be between 19-25. Your Body mass index is 20.78 kg/m. If this is out of the aformentioned range listed, please consider follow up with your Primary Care Provider.   We have sent the following medications to your pharmacy for you to pick up at your convenience:  Plenvu  You have been scheduled for a colonoscopy. Please follow written instructions given to you at your visit today.  Please pick up your prep supplies at the pharmacy within the next 1-3 days. If you use inhalers (even only as needed), please bring them with you on the day of your procedure. Your physician has requested that you go to www.startemmi.com and enter the access code given to you at your visit today. This web site gives a general overview about your procedure. However, you should still follow specific instructions given to you by our office regarding your preparation for the procedure.  Thank You.

## 2017-02-15 NOTE — Progress Notes (Signed)
[facility] Consult Note:  History: Shannon [name] 02/15/2017  Referring physician: Biagio Borg, MD  Reason for consult/chief complaint: Colon Cancer Screening (family hx colon ca )   Subjective  HPI:  This is a 47 year old woman referred to me by Dr. Jenny [name] for consideration of colon cancer screening, she has a father who was diagnosed with colon cancer.  However, she also reports years of frequent constipation that seems to come in episodes.  She may not have a bowel movement for several days, and then will have to frequently strain and then passed a large amount of blood.  This just occurred within the last few weeks.  She seems to be somewhat less concerned about this and her husband who accompanies her today.  There are sometimes when she moves her bowels without difficulty, but it sounds as if it is constipation more often than not.  It often feels that the stool just sits down in the rectum she will sit on the toilet a long time and strain without much happening.  When she does have a bowel movement, it is small pieces that are usually firm.  She has had no abdominal or pelvic surgery. ROS:  Review of Systems  Constitutional: Negative for appetite change and unexpected weight change.  HENT: Negative for mouth sores and voice change.   Eyes: Negative for pain and redness.  Respiratory: Negative for cough and shortness of breath.   Cardiovascular: Negative for chest pain and palpitations.  Genitourinary: Negative for dysuria and hematuria.  Musculoskeletal: Negative for arthralgias and myalgias.  Skin: Negative for pallor and rash.  Neurological: Negative for weakness and headaches.  Hematological: Negative for adenopathy.     Past Medical History: Past Medical History:  Diagnosis Date  . Allergic rhinitis   . ANEMIA-IRON DEFICIENCY 12/27/2006   Qualifier: Diagnosis of  By: Shannon Reichmann MD, [place] 12/27/2006   Qualifier: Diagnosis of  By: Shannon Reichmann MD,  [place] sting allergy   . Depressive disorder, not elsewhere classified 12/27/2006   Centricity Description: DEPRESSION Qualifier: Diagnosis of  By: Shannon Reichmann MD, [place] [facility]  Centricity Description: DEPRESSIVE DISORDER Qualifier: Diagnosis of  By: Shannon Reichmann MD, [facility]   . SINUSITIS, CHRONIC 09/11/2008   Qualifier: Diagnosis of  By: Shannon Reichmann MD, [place]      Past Surgical History: History reviewed. No pertinent surgical history.   Family History: Family History  Problem Relation Age of Onset  . Lung cancer Mother   . Colon cancer Father     Social History: Social History   Socioeconomic History  . Marital status: Married    Spouse name: None  . Number of children: None  . Years of education: None  . Highest education level: None  Social Needs  . Financial resource strain: None  . Food insecurity - worry: None  . Food insecurity - inability: None  . Transportation needs - medical: None  . Transportation needs - non-medical: None  Occupational History  . None  Tobacco Use  . Smoking status: Heavy Tobacco Smoker  . Smokeless tobacco: Never Used  Substance and Sexual Activity  . Alcohol use: Yes    Comment: 4/week  . Drug use: No  . Sexual activity: None  Other Topics Concern  . None  Social History Narrative  . None    Allergies: Allergies  Allergen Reactions  . Bee Venom     Outpatient Meds: Current Outpatient Medications  Medication  Sig Dispense Refill  . acetaminophen (TYLENOL) 500 MG tablet Take 500 mg by mouth every 6 (six) hours as needed.    Marland Kitchen PEG-KCl-NaCl-NaSulf-Na Asc-C (PLENVU) 140 g SOLR Take 1 kit by mouth once for 1 dose. 1 each 0   No current facility-administered medications for this visit.       ___________________________________________________________________ Objective   Exam:  BP 118/74   Pulse 68   Ht '5\' 1"'  (1.549 m)   Wt 110 lb (49.9 kg)   BMI 20.78 kg/m    General: this is a(n) thin African-American woman  Eyes: sclera  anicteric, no redness  ENT: oral mucosa moist without lesions, no cervical or supraclavicular lymphadenopathy, good dentition  CV: RRR without murmur, S1/S2, no JVD, no peripheral edema  Resp: clear to auscultation bilaterally, normal RR and effort noted  GI: soft, no tenderness, with active bowel sounds. No guarding or palpable organomegaly noted.  Skin; warm and dry, no rash or jaundice noted  Neuro: awake, alert and oriented x 3. Normal gross motor function and fluent speech  Labs:  CBC Latest Ref Rng & Units 11/01/2016 11/19/2008  WBC 4.0 - 10.5 K/uL 7.7 5.2  Hemoglobin 12.0 - 15.0 g/dL 12.0 13.8  Hematocrit 36.0 - 46.0 % 35.5(L) 39.4  Platelets 150.0 - 400.0 K/uL 343.0 311.0     Assessment: Encounter Diagnoses  Name Primary?  . Constipation, unspecified constipation type Yes  . Rectal bleeding   . Family history of colon cancer in father     While she does have a family history of colon cancer which puts her at increased risk, she has chronic symptoms of constipation and rectal bleeding.  It does not sound as if she has done anything particular to relieve the constipation.  Plan:  A written plan was outlined to relieve constipation with stool softeners and fiber supplement.  If that is insufficient, she will start once daily MiraLAX. Colonoscopy to workup the constipation and rectal bleeding.  The benefits and risks of the planned procedure were described in detail with the patient or (when appropriate) their health care proxy.  Risks were outlined as including, but not limited to, bleeding, infection, perforation, adverse medication reaction leading to cardiac or pulmonary decompensation, or pancreatitis (if ERCP).  The limitation of incomplete mucosal visualization was also discussed.  No guarantees or warranties were given.   Thank you for the courtesy of this consult.  Please call me with any questions or concerns.  Nelida Meuse III  CC: Shannon Borg, MD

## 2017-03-15 ENCOUNTER — Telehealth: Payer: Self-pay | Admitting: Internal Medicine

## 2017-03-15 ENCOUNTER — Telehealth: Payer: Self-pay | Admitting: Gastroenterology

## 2017-03-15 MED ORDER — METOCLOPRAMIDE HCL 5 MG PO TABS: 5.0000 mg | ORAL_TABLET | Freq: Four times a day (QID) | ORAL | 0 refills | Status: AC

## 2017-03-15 NOTE — Telephone Encounter (Signed)
Patient husband states ins does not cover prep called in for patient procedure tomorrow 2.27.19. Pt husband states their ins will cover suprep and requests it be sent to [facility].

## 2017-03-15 NOTE — Telephone Encounter (Signed)
Thanks for letting me know.  Give instructions for the typical  Miralax prep (with the ducolax) starting abut 5pm today.  Prescribe Reglan 5 mg, take one dose an hour before both the evening and Am prep doses.  Disp #2 tablets, no refills.  Hold on to other Plenvu dose in case has trouble with Miralax prep and needs it tomorrow Am

## 2017-03-15 NOTE — Telephone Encounter (Signed)
Pt's husband came to the [facility] this morning and picked up a Plenvu sample as her insurance would not cover the prep as her procedure is tomorrow.  When he got home with the prep, she drank the first dose.  She told me she was under the impression she had to do the doses 12 hours apart. ( she did not have a PV )   She mixed the first prep and vomited the entire 16 ounces.  She did not even try the 16 ounces of water to follow.   She still is heaving on the phone.  Her procedure is tomorrow 2-27 WED at 330 pm.     Please advise as to what you wish her to do as a prep from this point and can she have some reglan or zofran to use prior to her prep?  Thanks for your time, Marijean Niemann

## 2017-03-15 NOTE — Telephone Encounter (Signed)
I spoke to Shannon [name], [place] husband. A sample of Plenvu will be left at the front desk on the 4 th floor. Patient states that he is on the way to pick it up.   Riki Sheer, LPN

## 2017-03-15 NOTE — Telephone Encounter (Signed)
Disregard message. pts husband has called back and been connected with [facility].

## 2017-03-15 NOTE — Telephone Encounter (Signed)
Called pt and went over the new instructions for the Miralax Prep- informed her she needs to get a 64 ounce Gatorade, a 238 gram bottle Miralax and a box of 5 mg Dulcolax tablets - she also needs to pick up Reglan 5 mg tablets that I am calling in now.  I instructed pt to take 4 dulcolax tablets at 3 pm today , take 1 reglan at 4 pm. mix the Miralax/ Gatorade at 5 pm and drink 32 ounces, 8 oz every 15 minutes then the rest in the frig and drink clears til bedtime.  Tomorrow morning take reglan at 930 am and then start the 2nd half of Miralax/ Gatorade starting at 1030 am- 8 ounces every 15 minutes til gone, then wait 15 minutes and drink 16 ounces of a clear liquid.   If any further vomiting after 5 pm, call [phone].  Call [phone] before 5 pm with further issues. Instructed her to keep the A/B Plenvu packets in case she has  issues with the Miralax prep. Husband headed to [place] [facility] to get script and the other prep supplies.  Lelan Pons PV

## 2017-03-15 NOTE — Telephone Encounter (Signed)
Copied from [place] [phone]. Topic: Quick Communication - See Telephone Encounter >> Mar 15, 2017 10:13 AM Burnis Medin, NT wrote: CRM for notification. See Telephone encounter for: Patient husband called and said the doctor prescribed pt medication to have a colonoscopy done but doesn't remember the name. Husband said insurance won't cover medication and wanted to see if 0.9% Sodium Chloride Infusion can be called in to [facility] on [address]. Pt is scheduled for colonoscopy tomorrow.   03/15/17.

## 2017-03-16 ENCOUNTER — Ambulatory Visit (AMBULATORY_SURGERY_CENTER): Payer: Federal, State, Local not specified - PPO | Admitting: Gastroenterology

## 2017-03-16 ENCOUNTER — Encounter: Payer: Self-pay | Admitting: Gastroenterology

## 2017-03-16 ENCOUNTER — Other Ambulatory Visit: Payer: Self-pay

## 2017-03-16 VITALS — BP 157/92 | HR 92 | Temp 98.0°F | Resp 13 | Ht 61.0 in | Wt 110.0 lb

## 2017-03-16 DIAGNOSIS — K625 Hemorrhage of anus and rectum: Secondary | ICD-10-CM

## 2017-03-16 DIAGNOSIS — D122 Benign neoplasm of ascending colon: Secondary | ICD-10-CM | POA: Diagnosis not present

## 2017-03-16 DIAGNOSIS — K5900 Constipation, unspecified: Secondary | ICD-10-CM

## 2017-03-16 MED ORDER — SODIUM CHLORIDE 0.9 % IV SOLN: 500.0000 mL | Freq: Once | INTRAVENOUS | Status: DC

## 2017-03-16 NOTE — Progress Notes (Signed)
Report given to PACU, vss 

## 2017-03-16 NOTE — Patient Instructions (Signed)
**   Handouts given on polyps and hemorrhoids  YOU HAD AN ENDOSCOPIC PROCEDURE TODAY AT THE [facility]:   Refer to the procedure report that was given to you for any specific questions about what was found during the examination.  If the procedure report does not answer your questions, please call your gastroenterologist to clarify.  If you requested that your care partner not be given the details of your procedure findings, then the procedure report has been included in a sealed envelope for you to review at your convenience later.  YOU SHOULD EXPECT: Some feelings of bloating in the abdomen. Passage of more gas than usual.  Walking can help get rid of the air that was put into your GI tract during the procedure and reduce the bloating. If you had a lower endoscopy (such as a colonoscopy or flexible sigmoidoscopy) you may notice spotting of blood in your stool or on the toilet paper. If you underwent a bowel prep for your procedure, you may not have a normal bowel movement for a few days.  Please Note:  You might notice some irritation and congestion in your nose or some drainage.  This is from the oxygen used during your procedure.  There is no need for concern and it should clear up in a day or so.  SYMPTOMS TO REPORT IMMEDIATELY:   Following lower endoscopy (colonoscopy or flexible sigmoidoscopy):  Excessive amounts of blood in the stool  Significant tenderness or worsening of abdominal pains  Swelling of the abdomen that is new, acute  Fever of 100F or higher   For urgent or emergent issues, a gastroenterologist can be reached at any hour by calling [phone].   DIET:  We do recommend a small meal at first, but then you may proceed to your regular diet.  Drink plenty of fluids but you should avoid alcoholic beverages for 24 hours.  ACTIVITY:  You should plan to take it easy for the rest of today and you should NOT DRIVE or use heavy machinery until tomorrow (because of the  sedation medicines used during the test).    FOLLOW UP: Our staff will call the number listed on your records the next business day following your procedure to check on you and address any questions or concerns that you may have regarding the information given to you following your procedure. If we do not reach you, we will leave a message.  However, if you are feeling well and you are not experiencing any problems, there is no need to return our call.  We will assume that you have returned to your regular daily activities without incident.  If any biopsies were taken you will be contacted by phone or by letter within the next 1-3 weeks.  Please call us at [phone] if you have not heard about the biopsies in 3 weeks.    SIGNATURES/CONFIDENTIALITY: You and/or your care partner have signed paperwork which will be entered into your electronic medical record.  These signatures attest to the fact that that the information above on your After Visit Summary has been reviewed and is understood.  Full responsibility of the confidentiality of this discharge information lies with you and/or your care-partner. 

## 2017-03-16 NOTE — Progress Notes (Signed)
I have reviewed the patient's medical history in detail and updated the computerized patient record.

## 2017-03-16 NOTE — Op Note (Signed)
[name] Patient Name: Shannon [name] Procedure Date: 03/16/2017 2:48 PM MRN: [id number] Endoscopist: [place]. Loletha Carrow , MD Age: 47 Referring MD:  Date of Birth: 14-Dec-1970 Gender: Female Account #: [id number] Procedure:                Colonoscopy Indications:              Rectal bleeding, Constipation Medicines:                Monitored Anesthesia Care Procedure:                Pre-Anesthesia Assessment:                           - Prior to the procedure, a History and Physical                            was performed, and patient medications and                            allergies were reviewed. The patient's tolerance of                            previous anesthesia was also reviewed. The risks                            and benefits of the procedure and the sedation                            options and risks were discussed with the patient.                            All questions were answered, and informed consent                            was obtained. Prior Anticoagulants: The patient has                            taken no previous anticoagulant or antiplatelet                            agents. ASA Grade Assessment: II - A patient with                            mild systemic disease. After reviewing the risks                            and benefits, the patient was deemed in                            satisfactory condition to undergo the procedure.                           After obtaining informed consent, the colonoscope  was passed under direct vision. Throughout the                            procedure, the patient's blood pressure, pulse, and                            oxygen saturations were monitored continuously. The                            Colonoscope was introduced through the anus and                            advanced to the the cecum, identified by                            appendiceal orifice and ileocecal valve.  The                            colonoscopy was performed without difficulty. The                            patient tolerated the procedure well. The quality                            of the bowel preparation was excellent. The                            ileocecal valve, appendiceal orifice, and rectum                            were photographed. The bowel preparation used was                            Miralax. Scope In: 3:04:30 PM Scope Out: 3:15:41 PM Scope Withdrawal Time: 0 hours 8 minutes 35 seconds  Total Procedure Duration: 0 hours 11 minutes 11 seconds  Findings:                 The perianal and digital rectal examinations were                            normal.                           A 4 mm polyp was found in the ascending colon. The                            polyp was sessile. The polyp was removed with a                            cold snare. Resection and retrieval were complete.                           Internal hemorrhoids were found. The hemorrhoids  were small and Grade I (internal hemorrhoids that                            do not prolapse).                           The exam was otherwise without abnormality on                            direct and retroflexion views. Complications:            No immediate complications. Estimated Blood Loss:     Estimated blood loss was minimal. Impression:               - One 4 mm polyp in the ascending colon, removed                            with a cold snare. Resected and retrieved.                           - Internal hemorrhoids.                           - The examination was otherwise normal on direct                            and retroflexion views. Recommendation:           - Patient has a contact number available for                            emergencies. The signs and symptoms of potential                            delayed complications were discussed with the                             patient. Return to normal activities tomorrow.                            Written discharge instructions were provided to the                            patient.                           - Resume previous diet.                           - Continue present medications, including bowel                            regimen as outlined at recent office visit.                           - Await pathology results.                           -  Repeat colonoscopy in 5 years for surveillance                            and FAMILY HISTORY OF COLON CANCER. Nakoa Ganus L. Loletha Carrow, MD 03/16/2017 3:20:51 PM This report has been signed electronically.

## 2017-03-16 NOTE — Progress Notes (Signed)
Called to room to assist during endoscopic procedure.  Patient ID and intended procedure confirmed with present staff. Received instructions for my participation in the procedure from the performing physician.  

## 2017-03-17 ENCOUNTER — Telehealth: Payer: Self-pay

## 2017-03-17 NOTE — Telephone Encounter (Signed)
  Follow up Call-  Call Shannon [name] number 03/16/2017  Post procedure Call Shannon [name] phone  # [phone]  Permission to leave phone message Yes  Some recent data might be hidden     Patient questions:  Do you have a fever, pain , or abdominal swelling? No. Pain Score  0 *  Have you tolerated food without any problems? Yes.    Have you been able to return to your normal activities? Yes.    Do you have any questions about your discharge instructions: Diet   No. Medications  No. Follow up visit  No.  Do you have questions or concerns about your Care? No.  Actions: * If pain score is 4 or above: No action needed, pain <4.

## 2017-03-22 ENCOUNTER — Encounter: Payer: Self-pay | Admitting: Gastroenterology

## 2017-03-31 DIAGNOSIS — K136 Irritative hyperplasia of oral mucosa: Secondary | ICD-10-CM | POA: Diagnosis not present

## 2017-03-31 DIAGNOSIS — K1321 Leukoplakia of oral mucosa, including tongue: Secondary | ICD-10-CM | POA: Diagnosis not present

## 2017-04-01 DIAGNOSIS — K136 Irritative hyperplasia of oral mucosa: Secondary | ICD-10-CM | POA: Diagnosis not present

## 2017-04-01 DIAGNOSIS — K1321 Leukoplakia of oral mucosa, including tongue: Secondary | ICD-10-CM | POA: Diagnosis not present

## 2017-04-06 DIAGNOSIS — K136 Irritative hyperplasia of oral mucosa: Secondary | ICD-10-CM | POA: Diagnosis not present

## 2018-03-15 ENCOUNTER — Emergency Department (HOSPITAL_COMMUNITY)
Admission: EM | Admit: 2018-03-15 | Discharge: 2018-03-15 | Disposition: A | Discharge status: Home / Self Care | Payer: Federal, State, Local not specified - PPO | Attending: Emergency Medicine | Admitting: Emergency Medicine

## 2018-03-15 ENCOUNTER — Other Ambulatory Visit: Payer: Self-pay

## 2018-03-15 ENCOUNTER — Emergency Department (HOSPITAL_COMMUNITY): Payer: Federal, State, Local not specified - PPO

## 2018-03-15 ENCOUNTER — Encounter (HOSPITAL_COMMUNITY): Payer: Self-pay

## 2018-03-15 DIAGNOSIS — W540XXA Bitten by dog, initial encounter: Secondary | ICD-10-CM | POA: Insufficient documentation

## 2018-03-15 DIAGNOSIS — Y9389 Activity, other specified: Secondary | ICD-10-CM | POA: Diagnosis not present

## 2018-03-15 DIAGNOSIS — Z203 Contact with and (suspected) exposure to rabies: Secondary | ICD-10-CM | POA: Diagnosis not present

## 2018-03-15 DIAGNOSIS — Y92009 Unspecified place in unspecified non-institutional (private) residence as the place of occurrence of the external cause: Secondary | ICD-10-CM | POA: Diagnosis not present

## 2018-03-15 DIAGNOSIS — S50872A Other superficial bite of left forearm, initial encounter: Secondary | ICD-10-CM | POA: Diagnosis not present

## 2018-03-15 DIAGNOSIS — F329 Major depressive disorder, single episode, unspecified: Secondary | ICD-10-CM | POA: Diagnosis not present

## 2018-03-15 DIAGNOSIS — Z23 Encounter for immunization: Secondary | ICD-10-CM | POA: Diagnosis not present

## 2018-03-15 DIAGNOSIS — T148XXA Other injury of unspecified body region, initial encounter: Secondary | ICD-10-CM

## 2018-03-15 DIAGNOSIS — L089 Local infection of the skin and subcutaneous tissue, unspecified: Secondary | ICD-10-CM | POA: Insufficient documentation

## 2018-03-15 DIAGNOSIS — S61452A Open bite of left hand, initial encounter: Secondary | ICD-10-CM | POA: Diagnosis not present

## 2018-03-15 DIAGNOSIS — Y998 Other external cause status: Secondary | ICD-10-CM | POA: Diagnosis not present

## 2018-03-15 DIAGNOSIS — Z2914 Encounter for prophylactic rabies immune globin: Secondary | ICD-10-CM | POA: Diagnosis not present

## 2018-03-15 DIAGNOSIS — S51852A Open bite of left forearm, initial encounter: Secondary | ICD-10-CM | POA: Diagnosis not present

## 2018-03-15 DIAGNOSIS — Z79899 Other long term (current) drug therapy: Secondary | ICD-10-CM | POA: Diagnosis not present

## 2018-03-15 DIAGNOSIS — J45909 Unspecified asthma, uncomplicated: Secondary | ICD-10-CM | POA: Insufficient documentation

## 2018-03-15 DIAGNOSIS — F17210 Nicotine dependence, cigarettes, uncomplicated: Secondary | ICD-10-CM | POA: Insufficient documentation

## 2018-03-15 DIAGNOSIS — T8140XA Infection following a procedure, unspecified, initial encounter: Secondary | ICD-10-CM | POA: Diagnosis not present

## 2018-03-15 MED ORDER — RABIES IMMUNE GLOBULIN 150 UNIT/ML IM INJ
20.0000 [IU]/kg | INJECTION | Freq: Once | INTRAMUSCULAR | Status: AC
  Administered 2018-03-15: 975 [IU] via INTRAMUSCULAR
  Filled 2018-03-15: qty 8

## 2018-03-15 MED ORDER — LIDOCAINE HCL 0.5 % IJ SOLN
6.4000 mL | Freq: Once | INTRAMUSCULAR | Status: AC
  Administered 2018-03-15: 6.4 mL
  Filled 2018-03-15: qty 6.4

## 2018-03-15 MED ORDER — TETANUS-DIPHTH-ACELL PERTUSSIS 5-2.5-18.5 LF-MCG/0.5 IM SUSP
0.5000 mL | Freq: Once | INTRAMUSCULAR | Status: AC
  Administered 2018-03-15: 0.5 mL via INTRAMUSCULAR
  Filled 2018-03-15: qty 0.5

## 2018-03-15 MED ORDER — AMPICILLIN-SULBACTAM SODIUM 3 (2-1) G IJ SOLR
3.0000 g | Freq: Once | INTRAMUSCULAR | Status: AC
  Administered 2018-03-15: 3 g via INTRAMUSCULAR
  Filled 2018-03-15: qty 3

## 2018-03-15 MED ORDER — AMOXICILLIN-POT CLAVULANATE 875-125 MG PO TABS: 1.0000 | ORAL_TABLET | Freq: Two times a day (BID) | ORAL | 0 refills | Status: AC

## 2018-03-15 MED ORDER — RABIES VACCINE, PCEC IM SUSR
1.0000 mL | Freq: Once | INTRAMUSCULAR | Status: AC
  Administered 2018-03-15: 1 mL via INTRAMUSCULAR
  Filled 2018-03-15: qty 1

## 2018-03-15 NOTE — ED Triage Notes (Signed)
Patient states she was bit by a stray pi tbull in the left arm. Patient has 4 puncture wounds of the left arm, swelling and redness present.

## 2018-03-15 NOTE — ED Provider Notes (Signed)
[facility] DEPT Provider Note   CSN: [id number] Arrival date & time: 03/15/18  1720    History   Chief Complaint Chief Complaint  Patient presents with  . Animal Bite  . Arm Swelling    HPI Shannon [name] is a 48 y.o. female presented today with her husband following dog bite that occurred yesterday.  Patient reports that the stranger had brought a dog to her door saying that it was her dog and when she answered the door the unknown dog ran inside of her house.  She states that she was attacked by this dog which bit her on her left forearm before she was able to fight it off.  They have been unable to locate the dog.  Patient reports that she cleaned the wound herself at home last night however has had increasing pain and redness of the wound since that time.  Describes a moderate intensity throbbing sensation to her left forearm constant worsened with movement and palpation without alleviating factors.  She denies any other injury or pain from this altercation.  Patient requesting rabies shot today as well as tetanus shot.  She does not know when her last Tdap was.    HPI  Past Medical History:  Diagnosis Date  . Allergic rhinitis   . ANEMIA-IRON DEFICIENCY 12/27/2006   Qualifier: Diagnosis of  By: Jenny Reichmann MD, [place] 12/27/2006   Qualifier: Diagnosis of  By: Jenny Reichmann MD, [place] sting allergy   . Depressive disorder, not elsewhere classified 12/27/2006   Centricity Description: DEPRESSION Qualifier: Diagnosis of  By: Jenny Reichmann MD, [place]  Centricity Description: DEPRESSIVE DISORDER Qualifier: Diagnosis of  By: Jenny Reichmann MD, [place]   . SINUSITIS, CHRONIC 09/11/2008   Qualifier: Diagnosis of  By: Jenny Reichmann MD, [place]     Patient Active Problem List   Diagnosis Date Noted  . HLD (hyperlipidemia) 11/01/2016  . Family history of colon cancer 11/01/2016  . Encounter for well adult exam with abnormal findings 11/01/2016  . Abnormal liver function  tests 11/01/2016  . Low libido 11/01/2016  . Bee sting allergy   . Allergic rhinitis   . Blood in stool 11/19/2008  . OTITIS MEDIA, ACUTE, BILATERAL 09/11/2008  . SINUSITIS, CHRONIC 09/11/2008  . VERTIGO 08/30/2007  . INSOMNIA-SLEEP DISORDER-UNSPEC 08/30/2007  . TOBACCO ABUSE 06/29/2007  . ANEMIA-IRON DEFICIENCY 12/27/2006  . Situational depression 12/27/2006  . SINUSITIS- ACUTE-NOS 12/27/2006  . ASTHMA 12/27/2006  . MEIBOMIAN CYST 11/07/2006  . Sebaceous cyst 11/07/2006    History reviewed. No pertinent surgical history.   OB History   No obstetric history on file.      Home Medications    Prior to Admission medications   Medication Sig Start Date End Date Taking? Authorizing Provider  acetaminophen (TYLENOL) 500 MG tablet Take 500 mg by mouth every 6 (six) hours as needed.    [provider]  amoxicillin-clavulanate (AUGMENTIN) 875-125 MG tablet Take 1 tablet by mouth every 12 (twelve) hours for 10 days. 03/15/18 03/25/18  Nuala Alpha A, PA-C  buPROPion (WELLBUTRIN XL) 150 MG 24 hr tablet Take 150 mg by mouth daily.    [provider]  metoCLOPramide (REGLAN) 5 MG tablet Take 1 tablet (5 mg total) by mouth 4 (four) times daily. 03/15/17   Doran Stabler, MD    Family History Family History  Problem Relation Age of Onset  . Lung cancer Mother   . Colon  cancer Father     Social History Social History   Tobacco Use  . Smoking status: Current Every Day Smoker    Packs/day: 1.00    Types: Cigarettes  . Smokeless tobacco: Never Used  Substance Use Topics  . Alcohol use: Yes    Comment: 4/week  . Drug use: No     Allergies   Bee venom   Review of Systems Review of Systems  Constitutional: Negative.  Negative for chills and fever.  Respiratory: Negative.  Negative for shortness of breath.   Gastrointestinal: Negative.  Negative for abdominal pain, nausea and vomiting.  Musculoskeletal: Negative.  Negative for back pain and neck pain.   Skin: Positive for color change and wound.  Neurological: Negative.  Negative for dizziness, syncope, weakness, numbness and headaches.   Physical Exam Updated Vital Signs BP 132/88   Pulse (!) 111   Temp 100 F (37.8 C) (Oral)   Resp 20   Ht 5\' 1"  (1.549 m)   Wt 50.3 kg   SpO2 94%   BMI 20.97 kg/m   Physical Exam Constitutional:      General: She is not in acute distress.    Appearance: Normal appearance. She is not ill-appearing or diaphoretic.  HENT:     Head: Normocephalic and atraumatic. No raccoon eyes, Battle's sign, abrasion or contusion.     Jaw: There is normal jaw occlusion. No trismus.     Right Ear: Tympanic membrane, ear canal and external ear normal. No hemotympanum.     Left Ear: Tympanic membrane, ear canal and external ear normal. No hemotympanum.     Ears:     Comments: Hearing grossly intact bilaterally    Nose: Nose normal. No nasal tenderness or rhinorrhea.     Right Nostril: No epistaxis.     Left Nostril: No epistaxis.     Mouth/Throat:     Lips: Pink.     Mouth: Mucous membranes are moist.     Pharynx: Oropharynx is clear. Uvula midline.  Eyes:     General: Vision grossly intact. Gaze aligned appropriately.     Extraocular Movements: Extraocular movements intact.     Conjunctiva/sclera: Conjunctivae normal.     Pupils: Pupils are equal, round, and reactive to light.     Comments: Visual fields grossly intact bilaterally  Neck:     Musculoskeletal: Full passive range of motion without pain, normal range of motion and neck supple. No neck rigidity or spinous process tenderness.     Trachea: Trachea and phonation normal. No tracheal tenderness or tracheal deviation.  Cardiovascular:     Rate and Rhythm: Normal rate and regular rhythm.     Pulses:          Dorsalis pedis pulses are 2+ on the right side and 2+ on the left side.       Posterior tibial pulses are 2+ on the right side and 2+ on the left side.     Heart sounds: Normal heart sounds.    Pulmonary:     Effort: Pulmonary effort is normal. No respiratory distress.     Breath sounds: Normal breath sounds and air entry. No decreased breath sounds.  Chest:     Chest wall: No tenderness.  Abdominal:     General: Bowel sounds are normal. There is no distension.     Palpations: Abdomen is soft.     Tenderness: There is no abdominal tenderness. There is no guarding or rebound.     Comments: No  sign of injury to the abdomen  Musculoskeletal:     Comments: No midline C/T/L spinal tenderness to palpation, no deformity, crepitus, or step-off noted. No sign of injury to the neck or back.  Hips stable to compression bilaterally. Patient able to actively bring knees towards chest bilaterally without pain.  All major joints brought through range of motion without crepitus or deformity.  Multiple wounds of the left forearm with surrounding erythema, no current drainage.  Compartments soft to palpation.  Bilateral upper extremities neurovascularly intact with equal radial pulses, sensation, capillary refill and movement.  There is no pain with movement of the elbow or wrist.  Please see pictures below  Feet:     Right foot:     Protective Sensation: 3 sites tested. 3 sites sensed.     Left foot:     Protective Sensation: 3 sites tested. 3 sites sensed.  Skin:    General: Skin is warm and dry.     Capillary Refill: Capillary refill takes less than 2 seconds.  Neurological:     Mental Status: She is alert and oriented to person, place, and time.     GCS: GCS eye subscore is 4. GCS verbal subscore is 5. GCS motor subscore is 6.     Comments: Speech is clear and goal oriented, follows commands Major Cranial nerves without deficit, no facial droop Normal strength in upper and lower extremities bilaterally including dorsiflexion and plantar flexion, strong and equal grip strength Sensation normal to light touch Moves extremities without ataxia, coordination intact Normal finger to nose  maze Neg romberg, no pronator drift Normal gait  Psychiatric:        Behavior: Behavior is cooperative.         ED Treatments / Results  Labs (all labs ordered are listed, but only abnormal results are displayed) Labs Reviewed - No data to display  EKG None  Radiology Dg Forearm Left  Result Date: 03/15/2018 CLINICAL DATA:  Dog bite EXAM: LEFT FOREARM - 2 VIEW COMPARISON:  None. FINDINGS: There is no evidence of fracture or other focal bone lesions. Soft tissues are unremarkable. No soft tissue gas or radiopaque foreign body. IMPRESSION: Negative. Electronically Signed   By: Rolm Baptise M.D.   On: 03/15/2018 19:19    Procedures Procedures (including critical care time)  Medications Ordered in ED Medications  Ampicillin-Sulbactam (UNASYN) injection 3 g (3 g Intramuscular Given 03/15/18 1950)    And  lidocaine (XYLOCAINE) 0.5 % (with pres) injection 6.4 mL (6.4 mLs Other Given 03/15/18 1950)  Tdap (BOOSTRIX) injection 0.5 mL (0.5 mLs Intramuscular Given 03/15/18 1947)  rabies immune globulin (HYPERAB/KEDRAB) injection 975 Units (975 Units Intramuscular Given 03/15/18 2002)  rabies vaccine (RABAVERT) injection 1 mL (1 mL Intramuscular Given 03/15/18 1957)     Initial Impression / Assessment and Plan / ED Course  I have reviewed the triage vital signs and the nursing notes.  Pertinent labs & imaging results that were available during my care of the patient were reviewed by me and considered in my medical decision making (see chart for details).    48 year old female presenting today after an attack from an unknown dog that occurred yesterday.  She has not sought medical treatment prior to this visit.  Patient with obvious cellulitis of the left upper extremity around where the dog has bit her.  She is neurovascular intact to bilateral upper extremities.  No other sign of injury today, no sign of head injury, neck or back injury,  no sign of chest/abdominal injury.  She is overall  very well-appearing, nontoxic and in no acute distress. - Patient seen and evaluated with Dr. Kathrynn Humble who has ordered Unasyn IM today. Tdap updated Rabies immunizations begun today.  Patient informed that she must have the rabies series completed and that her next shots are due in 3 days. Prescription given for Augmentin and patient informed of the need for medication compliance. I have also encouraged her to present for a wound recheck here at the ED in the next 2 days and sooner if condition worsens.  At this time there does not appear to be any evidence of an acute emergency medical condition and the patient appears stable for discharge with appropriate outpatient follow up. Diagnosis was discussed with patient who verbalizes understanding of care plan and is agreeable to discharge. I have discussed return precautions with patient and husband who verbalize understanding of return precautions. Patient strongly encouraged to follow-up as above. All questions answered.  Patient has been discharged in good condition.  Patient's case rediscussed with Dr. Kathrynn Humble who agrees with plan to discharge with follow-up.   Note: Portions of this report may have been transcribed using voice recognition software. Every effort was made to ensure accuracy; however, inadvertent computerized transcription errors may still be present.  Final Clinical Impressions(s) / ED Diagnoses   Final diagnoses:  Dog bite, initial encounter  Wound infection    ED Discharge Orders         Ordered    amoxicillin-clavulanate (AUGMENTIN) 875-125 MG tablet  Every 12 hours     03/15/18 2046           Gari Crown 03/15/18 2058    Varney Biles, MD 03/16/18 [phone]

## 2018-03-15 NOTE — Discharge Instructions (Addendum)
You have been diagnosed today with wound infection after dog bite.  At this time there does not appear to be the presence of an emergent medical condition, however there is always the potential for conditions to change. Please read and follow the below instructions.  Please return to the Emergency Department immediately for any new or worsening symptoms. Please be sure to follow up with your Primary Care Provider within one week regarding your visit today; please call their office to schedule an appointment even if you are feeling better for a follow-up visit. Please take your antibiotic Augmentin as prescribed to treat your infection. You must have your rabies vaccination series completed.  Return to the emergency department in 3 days on 03/18/2018 to have your next rabies shot. Please have your wound checked here at the emergency department in 2 days to ensure that your infection is improving.   Please read the additional information packets attached to your discharge summary.  Do not take your medicine if  develop an itchy rash, swelling in your mouth or lips, or difficulty breathing.

## 2018-03-15 NOTE — ED Notes (Signed)
Patient refuses blood draw. 

## 2018-04-09 ENCOUNTER — Emergency Department (HOSPITAL_COMMUNITY): Payer: Federal, State, Local not specified - PPO

## 2018-04-09 ENCOUNTER — Other Ambulatory Visit: Payer: Self-pay

## 2018-04-09 ENCOUNTER — Encounter (HOSPITAL_COMMUNITY): Payer: Self-pay | Admitting: Emergency Medicine

## 2018-04-09 ENCOUNTER — Emergency Department (HOSPITAL_COMMUNITY)
Admission: EM | Admit: 2018-04-09 | Discharge: 2018-04-09 | Disposition: A | Discharge status: Home / Self Care | Payer: Federal, State, Local not specified - PPO | Attending: Emergency Medicine | Admitting: Emergency Medicine

## 2018-04-09 DIAGNOSIS — R404 Transient alteration of awareness: Secondary | ICD-10-CM | POA: Diagnosis not present

## 2018-04-09 DIAGNOSIS — Z79899 Other long term (current) drug therapy: Secondary | ICD-10-CM | POA: Insufficient documentation

## 2018-04-09 DIAGNOSIS — R0902 Hypoxemia: Secondary | ICD-10-CM | POA: Diagnosis not present

## 2018-04-09 DIAGNOSIS — R410 Disorientation, unspecified: Secondary | ICD-10-CM | POA: Diagnosis not present

## 2018-04-09 DIAGNOSIS — R930 Abnormal findings on diagnostic imaging of skull and head, not elsewhere classified: Secondary | ICD-10-CM | POA: Diagnosis not present

## 2018-04-09 DIAGNOSIS — F17210 Nicotine dependence, cigarettes, uncomplicated: Secondary | ICD-10-CM | POA: Diagnosis not present

## 2018-04-09 DIAGNOSIS — R000 Tachycardia, unspecified: Secondary | ICD-10-CM | POA: Insufficient documentation

## 2018-04-09 DIAGNOSIS — R569 Unspecified convulsions: Secondary | ICD-10-CM | POA: Diagnosis not present

## 2018-04-09 LAB — CBC WITH DIFFERENTIAL/PLATELET
ABS IMMATURE GRANULOCYTES: 0.04 10*3/uL (ref 0.00–0.07)
BASOS PCT: 1 %
Basophils Absolute: 0.1 10*3/uL (ref 0.0–0.1)
Eosinophils Absolute: 0 10*3/uL (ref 0.0–0.5)
Eosinophils Relative: 0 %
HCT: 36.6 % (ref 36.0–46.0)
Hemoglobin: 12.1 g/dL (ref 12.0–15.0)
Immature Granulocytes: 0 %
Lymphocytes Relative: 4 %
Lymphs Abs: 0.4 10*3/uL — ABNORMAL LOW (ref 0.7–4.0)
MCH: 33 pg (ref 26.0–34.0)
MCHC: 33.1 g/dL (ref 30.0–36.0)
MCV: 99.7 fL (ref 80.0–100.0)
MONO ABS: 0.3 10*3/uL (ref 0.1–1.0)
MONOS PCT: 3 %
Neutro Abs: 9.3 10*3/uL — ABNORMAL HIGH (ref 1.7–7.7)
Neutrophils Relative %: 92 %
Platelets: 262 10*3/uL (ref 150–400)
RBC: 3.67 MIL/uL — ABNORMAL LOW (ref 3.87–5.11)
RDW: 14.8 % (ref 11.5–15.5)
WBC: 10.2 10*3/uL (ref 4.0–10.5)
nRBC: 0 % (ref 0.0–0.2)

## 2018-04-09 LAB — COMPREHENSIVE METABOLIC PANEL
ALT: 27 U/L (ref 0–44)
AST: 64 U/L — ABNORMAL HIGH (ref 15–41)
Albumin: 3.7 g/dL (ref 3.5–5.0)
Alkaline Phosphatase: 69 U/L (ref 38–126)
Anion gap: 12 (ref 5–15)
BILIRUBIN TOTAL: 0.6 mg/dL (ref 0.3–1.2)
BUN: 5 mg/dL — ABNORMAL LOW (ref 6–20)
CO2: 22 mmol/L (ref 22–32)
Calcium: 9 mg/dL (ref 8.9–10.3)
Chloride: 100 mmol/L (ref 98–111)
Creatinine, Ser: 0.67 mg/dL (ref 0.44–1.00)
GFR calc non Af Amer: >60 mL/min (ref >60)
Glucose, Bld: 99 mg/dL (ref 70–99)
Potassium: 4.4 mmol/L (ref 3.5–5.1)
Sodium: 134 mmol/L — ABNORMAL LOW (ref 135–145)
Total Protein: 6.5 g/dL (ref 6.5–8.1)

## 2018-04-09 LAB — MAGNESIUM: Magnesium: 1.9 mg/dL (ref 1.7–2.4)

## 2018-04-09 MED ORDER — CHLORDIAZEPOXIDE HCL 25 MG PO CAPS
50.0000 mg | ORAL_CAPSULE | Freq: Once | ORAL | Status: AC
  Administered 2018-04-09: 50 mg via ORAL
  Filled 2018-04-09: qty 2

## 2018-04-09 MED ORDER — GADOBUTROL 1 MMOL/ML IV SOLN
5.0000 mL | Freq: Once | INTRAVENOUS | Status: AC | PRN
  Administered 2018-04-09: 5 mL via INTRAVENOUS

## 2018-04-09 MED ORDER — CHLORDIAZEPOXIDE HCL 25 MG PO CAPS: ORAL_CAPSULE | ORAL | 0 refills | Status: DC

## 2018-04-09 MED ORDER — ACETAMINOPHEN 325 MG PO TABS
650.0000 mg | ORAL_TABLET | Freq: Once | ORAL | Status: AC
  Administered 2018-04-09: 650 mg via ORAL
  Filled 2018-04-09: qty 2

## 2018-04-09 NOTE — ED Triage Notes (Signed)
Pt here via EMS for 2 seizures described as full body shaking witnessed by husband. Hx of alcohol abuse. Pt postictal on arrival, pt Alert x 3 but unaware to event. 176/114, HR 116, 20 RR, 97.7 temp, CBG 130

## 2018-04-09 NOTE — ED Notes (Signed)
Patient transported to CT 

## 2018-04-09 NOTE — ED Notes (Signed)
Patient transported to MRI 

## 2018-04-09 NOTE — ED Provider Notes (Signed)
[facility] EMERGENCY DEPARTMENT Provider Note   CSN: [id number] Arrival date & time: 04/09/18  1516    History   Chief Complaint Chief Complaint  Patient presents with  . Seizures    HPI Shannon [name] is a 48 y.o. female.  She is brought in by EMS for possible seizure.  Per EMS she had 2 seizures at home although patient does not recall this at all.  She is never had a seizure before.  She says she feels well.  No headache no chest pain or shortness of breath no abdominal pain no nausea vomiting diarrhea.  Sounds like she may be drinks frequently but does not have any history of withdrawal symptoms.  Denies any drugs.  No tongue biting or incontinence.  Seizures were witnessed by her husband who is not here yet.   Husband is here now and was interviewed while the patient was at [place].  He is concerned that she is drinking more than she laid onto and is afraid she drinks while he is at work at night.  She had 2 episodes of seizures today each lasting about 10 seconds where she had some stiffening of her body and some foaming at her mouth.  He said she is quickly responsive with the first 1 but the second 1 took her a longer time to know where she was.  She is never had this before.   Seizures  Seizure activity on arrival: no   Seizure type:  Myoclonic Initial focality:  Diffuse Episode characteristics: confusion and stiffening   Postictal symptoms: confusion   Return to baseline: yes   Severity:  Unable to specify Duration:  10 seconds Timing:  Once Number of seizures this episode:  2 Progression:  Resolved Context: alcohol withdrawal   Context: not fever, not flashing visual stimuli and not previous head injury   Recent head injury:  No recent head injuries PTA treatment:  None History of seizures: no     Past Medical History:  Diagnosis Date  . Allergic rhinitis   . ANEMIA-IRON DEFICIENCY 12/27/2006   Qualifier: Diagnosis of  By: Jenny Reichmann MD, [place] 12/27/2006   Qualifier: Diagnosis of  By: Jenny Reichmann MD, [place] sting allergy   . Depressive disorder, not elsewhere classified 12/27/2006   Centricity Description: DEPRESSION Qualifier: Diagnosis of  By: Jenny Reichmann MD, [place]  Centricity Description: DEPRESSIVE DISORDER Qualifier: Diagnosis of  By: Jenny Reichmann MD, [place]   . SINUSITIS, CHRONIC 09/11/2008   Qualifier: Diagnosis of  By: Jenny Reichmann MD, [place]     Patient Active Problem List   Diagnosis Date Noted  . HLD (hyperlipidemia) 11/01/2016  . Family history of colon cancer 11/01/2016  . Encounter for well adult exam with abnormal findings 11/01/2016  . Abnormal liver function tests 11/01/2016  . Low libido 11/01/2016  . Bee sting allergy   . Allergic rhinitis   . Blood in stool 11/19/2008  . OTITIS MEDIA, ACUTE, BILATERAL 09/11/2008  . SINUSITIS, CHRONIC 09/11/2008  . VERTIGO 08/30/2007  . INSOMNIA-SLEEP DISORDER-UNSPEC 08/30/2007  . TOBACCO ABUSE 06/29/2007  . ANEMIA-IRON DEFICIENCY 12/27/2006  . Situational depression 12/27/2006  . SINUSITIS- ACUTE-NOS 12/27/2006  . ASTHMA 12/27/2006  . MEIBOMIAN CYST 11/07/2006  . Sebaceous cyst 11/07/2006    History reviewed. No pertinent surgical history.   OB History   No obstetric history on file.      Home Medications    Prior to Admission  medications   Medication Sig Start Date End Date Taking? Authorizing Provider  acetaminophen (TYLENOL) 500 MG tablet Take 500 mg by mouth every 6 (six) hours as needed.    [provider]  buPROPion (WELLBUTRIN XL) 150 MG 24 hr tablet Take 150 mg by mouth daily.    [provider]  metoCLOPramide (REGLAN) 5 MG tablet Take 1 tablet (5 mg total) by mouth 4 (four) times daily. 03/15/17   Doran Stabler, MD    Family History Family History  Problem Relation Age of Onset  . Lung cancer Mother   . Colon cancer Father     Social History Social History   Tobacco Use  . Smoking status: Current Every Day Smoker     Packs/day: 1.00    Types: Cigarettes  . Smokeless tobacco: Never Used  Substance Use Topics  . Alcohol use: Yes    Comment: 4/week  . Drug use: No     Allergies   Bee venom   Review of Systems Review of Systems  Constitutional: Negative for fever.  HENT: Negative for sore throat.   Eyes: Negative for visual disturbance.  Respiratory: Negative for shortness of breath.   Cardiovascular: Negative for chest pain.  Gastrointestinal: Negative for abdominal pain.  Genitourinary: Negative for dysuria.  Musculoskeletal: Negative for neck pain.  Skin: Negative for rash.  Neurological: Positive for seizures.     Physical Exam Updated Vital Signs BP (!) 133/98   Pulse 90   Resp 16   Ht 5\' 2"  (1.575 m)   Wt 49.9 kg   SpO2 98%   BMI 20.12 kg/m   Physical Exam Vitals signs and nursing note reviewed.  Constitutional:      General: She is not in acute distress.    Appearance: She is well-developed.  HENT:     Head: Normocephalic and atraumatic.  Eyes:     Conjunctiva/sclera: Conjunctivae normal.  Neck:     Musculoskeletal: Neck supple.  Cardiovascular:     Rate and Rhythm: Regular rhythm. Tachycardia present.     Heart sounds: No murmur.  Pulmonary:     Effort: Pulmonary effort is normal. No respiratory distress.     Breath sounds: Normal breath sounds.  Abdominal:     Palpations: Abdomen is soft.     Tenderness: There is no abdominal tenderness.  Skin:    General: Skin is warm and dry.     Capillary Refill: Capillary refill takes less than 2 seconds.  Neurological:     General: No focal deficit present.     Mental Status: She is alert and oriented to person, place, and time.     Sensory: No sensory deficit.     Motor: No weakness.      ED Treatments / Results  Labs (all labs ordered are listed, but only abnormal results are displayed) Labs Reviewed  CBC WITH DIFFERENTIAL/PLATELET - Abnormal; Notable for the following components:      Result Value   RBC  3.67 (*)    Neutro Abs 9.3 (*)    Lymphs Abs 0.4 (*)    All other components within normal limits  COMPREHENSIVE METABOLIC PANEL - Abnormal; Notable for the following components:   Sodium 134 (*)    BUN 5 (*)    AST 64 (*)    All other components within normal limits  MAGNESIUM    EKG EKG Interpretation  Date/Time:  Sunday April 09 2018 15:24:22 EDT Ventricular Rate:  110 PR Interval:  QRS Duration: 95 QT Interval:  336 QTC Calculation: 455 R Axis:   -154 Text Interpretation:  Sinus tachycardia Low voltage with right axis deviation Abnormal R-wave progression, late transition no prior to compare with Confirmed by Aletta Edouard [phone]) on 04/09/2018 3:27:28 PM   Radiology Ct Head Wo Contrast  Result Date: 04/09/2018 CLINICAL DATA:  Seizures EXAM: CT HEAD WITHOUT CONTRAST TECHNIQUE: Contiguous axial images were obtained from the base of the skull through the vertex without intravenous contrast. COMPARISON:  None. FINDINGS: Brain: There is no evidence for acute hemorrhage, hydrocephalus, mass lesion, or abnormal extra-axial fluid collection. No definite CT evidence for acute infarction. Tiny hypodensity noted right parietooccipital region (image 17/3) Vascular: No hyperdense vessel or unexpected calcification. Skull: No evidence for fracture. No worrisome lytic or sclerotic lesion. Sinuses/Orbits: The visualized paranasal sinuses and mastoid air cells are clear. Visualized portions of the globes and intraorbital fat are unremarkable. Other: None. IMPRESSION: 6 mm hypoattenuating focus right parietooccipital region, nonspecific. This may represent an age-indeterminate lacunar infarct. MRI without and with contrast could be used to further evaluate. Electronically Signed   By: Misty Stanley M.D.   On: 04/09/2018 16:21   Mr Jeri Cos And Wo Contrast  Result Date: 04/09/2018 CLINICAL DATA:  Abnormal CT.  Seizure, new, nontraumatic. EXAM: MRI HEAD WITHOUT AND WITH CONTRAST TECHNIQUE:  Multiplanar, multiecho pulse sequences of the brain and surrounding structures were obtained without and with intravenous contrast. CONTRAST:  5 mL Gadavist COMPARISON:  CT head without contrast of the same day. FINDINGS: Brain: No acute infarct, hemorrhage, or mass lesion is present. The hypodense area on CT appears to represent the terminal aspect of the posterior horn of the right lateral ventricle. No significant white matter disease is present. Basal ganglia are intact. The internal auditory canals are within normal limits. Brainstem is within normal limits. Mild cerebellar atrophy may be related to alcohol abuse. Dedicated imaging the temporal lobes demonstrate symmetric size and signal of the hippocampal structures. Postcontrast images demonstrate no pathologic enhancement. Vascular: Flow is present in the major intracranial arteries. Skull and upper cervical spine: The craniocervical junction is normal. Upper cervical spine is within normal limits. Marrow signal is unremarkable. Sinuses/Orbits: The paranasal sinuses and mastoid air cells are clear. The globes and orbits are within normal limits. IMPRESSION: 1. No acute or subacute intracranial abnormality. 2. No acute or focal lesion to explain seizures. 3. Mild cerebellar atrophy, likely reflecting sequela of chronic alcohol abuse. Electronically Signed   By: San Morelle M.D.   On: 04/09/2018 20:43    Procedures Procedures (including critical care time)  Medications Ordered in ED Medications  chlordiazePOXIDE (LIBRIUM) capsule 50 mg (50 mg Oral Given 04/09/18 1629)  gadobutrol (GADAVIST) 1 MMOL/ML injection 5 mL (5 mLs Intravenous Contrast Given 04/09/18 2011)  acetaminophen (TYLENOL) tablet 650 mg (650 mg Oral Given 04/09/18 2145)  chlordiazePOXIDE (LIBRIUM) capsule 50 mg (50 mg Oral Given 04/09/18 2146)     Initial Impression / Assessment and Plan / ED Course  I have reviewed the triage vital signs and the nursing notes.  Pertinent  labs & imaging results that were available during my care of the patient were reviewed by me and considered in my medical decision making (see chart for details).  Clinical Course as of Apr 09 1156  Sun Apr 09, 2018  1637 Patient with new onset seizures.  Possibly related to alcohol overuse.  I thought she might be on Wellbutrin except on her med list but it sounds  like she is not taking that.  Other things in the differential are mass.  She has an abnormal CT.  I reviewed this with Dr. Leonel Ramsay from neurology was recommending proceeding with an MRI with and without.  If that is negative he said she can follow-up outpatient and if positive please give him a call back for further recommendations.   [MB]  4098 EKG and CT head independently reviewed by me.   [MB]  2053 MRI unremarkable.  Will discharge with Librium taper and resources.  Outpatient neuro.   [MB]  2112 Reviewed the results with the patient and her husband.  She is complaining of a headache and still looks a little tremulous.  Ordered some Tylenol and another dose of Librium prior to discharge.  They understand to return if any worsening symptoms.   [MB]    Clinical Course User Index [MB] Hayden Rasmussen, MD       Final Clinical Impressions(s) / ED Diagnoses   Final diagnoses:  Seizures [facility])    ED Discharge Orders         Ordered    chlordiazePOXIDE (LIBRIUM) 25 MG capsule     04/09/18 2054    Ambulatory referral to Neurology    Comments:  An appointment is requested in approximately: 2 weeks   04/09/18 2054           Hayden Rasmussen, MD 04/10/18 1158

## 2018-04-09 NOTE — Discharge Instructions (Addendum)
You were seen in the emergency department after 2 seizures at home.  You had blood work CAT scan and MRI that did not show an obvious explanation for your symptoms.  This may be related to alcohol use.  We are prescribing you Librium which may help with withdrawal symptoms if you choose to stop drinking.  Will be important for you to contact your doctor and update them.  We have placed a referral into neurology.  We are also providing you with some resources to help with stopping drinking.  Return to the emergency department if any concerns.

## 2018-04-10 ENCOUNTER — Telehealth: Payer: Self-pay

## 2018-04-10 NOTE — Telephone Encounter (Signed)
Pt has been screened for COVID-19. Denies any fever, chills, SOB, cough, n/v/d.  She will keep her ER follow up appt and has been advised no additional visitors can come with her to limit exposure. She has expressed understanding.

## 2018-04-11 ENCOUNTER — Encounter: Payer: Self-pay | Admitting: Internal Medicine

## 2018-04-11 ENCOUNTER — Other Ambulatory Visit: Payer: Self-pay

## 2018-04-11 ENCOUNTER — Ambulatory Visit (INDEPENDENT_AMBULATORY_CARE_PROVIDER_SITE_OTHER)
Admission: RE | Admit: 2018-04-11 | Discharge: 2018-04-11 | Disposition: A | Discharge status: Home / Self Care | Payer: Federal, State, Local not specified - PPO | Source: Ambulatory Visit | Attending: Internal Medicine | Admitting: Internal Medicine

## 2018-04-11 ENCOUNTER — Ambulatory Visit: Payer: Federal, State, Local not specified - PPO | Admitting: Internal Medicine

## 2018-04-11 VITALS — BP 116/74 | HR 107 | Temp 98.7°F | Ht 62.0 in

## 2018-04-11 DIAGNOSIS — G319 Degenerative disease of nervous system, unspecified: Secondary | ICD-10-CM | POA: Diagnosis not present

## 2018-04-11 DIAGNOSIS — F1010 Alcohol abuse, uncomplicated: Secondary | ICD-10-CM

## 2018-04-11 DIAGNOSIS — R569 Unspecified convulsions: Secondary | ICD-10-CM

## 2018-04-11 DIAGNOSIS — M25562 Pain in left knee: Secondary | ICD-10-CM

## 2018-04-11 MED ORDER — CHLORDIAZEPOXIDE HCL 25 MG PO CAPS: ORAL_CAPSULE | ORAL | 1 refills | Status: AC

## 2018-04-11 MED ORDER — TRAMADOL HCL 50 MG PO TABS: 50.0000 mg | ORAL_TABLET | Freq: Four times a day (QID) | ORAL | 0 refills | Status: AC | PRN

## 2018-04-11 NOTE — Assessment & Plan Note (Signed)
Likely ETOH related, d/w pt, could be possibly related to gait worsening, consider neurology referral

## 2018-04-11 NOTE — Progress Notes (Signed)
Subjective:    Patient ID: Shannon [name], female    DOB: [birth year], 48 y.o.   MRN: [id number]  HPI  Here with friend who lives nearby as husband not able to be here today; she is s/p ED visit 3/22 after witnessed siezure x 2, of which pt was unaware.  Denies worsening depressive symptoms, suicidal ideation, or panic; has ongoing anxiety, and persistent ETOH abuse now up to 4-6 beers per day in addition to at least 2 shots of tequila per day.  Has no prior hx of known dependence, withdrawal or siezure. Friend does state she has worsening ambulatory stability   MRI brain neg except for cerebellar atrophy , routine labs unremarkable, tx with librium course and advised f/u here.  Pt is in denial of her alcoholism, doesn't believe she has a problem though I d/w her multiple times today.   Wt Readings from Last 3 Encounters:  04/09/18 110 lb (49.9 kg)  03/15/18 111 lb (50.3 kg)  03/16/17 110 lb (49.9 kg)  Also unfortunately last PM tripped over the dog and landed on the floor to the left knee, has an abrasion, mild swelling and persistent at least mod pain, asks for film to r/o fx and pain med. Past Medical History:  Diagnosis Date  . Allergic rhinitis   . ANEMIA-IRON DEFICIENCY 12/27/2006   Qualifier: Diagnosis of  By: Jenny Reichmann MD, [place] 12/27/2006   Qualifier: Diagnosis of  By: Jenny Reichmann MD, [place] sting allergy   . Depressive disorder, not elsewhere classified 12/27/2006   Centricity Description: DEPRESSION Qualifier: Diagnosis of  By: Jenny Reichmann MD, [place]  Centricity Description: DEPRESSIVE DISORDER Qualifier: Diagnosis of  By: Jenny Reichmann MD, [place]   . SINUSITIS, CHRONIC 09/11/2008   Qualifier: Diagnosis of  By: Jenny Reichmann MD, [place]    No past surgical history on file.  reports that she has been smoking cigarettes. She has been smoking about 1.00 pack per day. She has never used smokeless tobacco. She reports current alcohol use. She reports that she does not use drugs. family history  includes Colon cancer in her father; Lung cancer in her mother. Allergies  Allergen Reactions  . Bee Venom Shortness Of Breath   Current Outpatient Medications on File Prior to Visit  Medication Sig Dispense Refill  . acetaminophen (TYLENOL) 500 MG tablet Take 500 mg by mouth every 6 (six) hours as needed for mild pain.     Marland Kitchen metoCLOPramide (REGLAN) 5 MG tablet Take 1 tablet (5 mg total) by mouth 4 (four) times daily. 2 tablet 0   No current facility-administered medications on file prior to visit.    Review of Systems  Constitutional: Negative for other unusual diaphoresis or sweats HENT: Negative for ear discharge or swelling Eyes: Negative for other worsening visual disturbances Respiratory: Negative for stridor or other swelling  Gastrointestinal: Negative for worsening distension or other blood Genitourinary: Negative for retention or other urinary change Musculoskeletal: Negative for other MSK pain or swelling Skin: Negative for color change or other new lesions Neurological: Negative for worsening tremors and other numbness  Psychiatric/Behavioral: Negative for worsening agitation or other fatigue All other system neg per pt    Objective:   Physical Exam BP 116/74   Pulse (!) 107   Temp 98.7 F (37.1 C) (Oral)   Ht 5\' 2"  (1.575 m)   SpO2 98%   BMI 20.12 kg/m  VS noted, low normal wt, appears likely malnourished  Constitutional: Pt appears in NAD HENT: Head: NCAT.  Right Ear: External ear normal.  Left Ear: External ear normal.  Eyes: . Pupils are equal, round, and reactive to light. Conjunctivae and EOM are normal Nose: without d/c or deformity Neck: Neck supple. Gross normal ROM Cardiovascular: Normal rate and regular rhythm.   Pulmonary/Chest: Effort normal and breath sounds without rales or wheezing.  Left knee with trace swelling anterior with mild abrasion, o/w FROM Neurological: Pt is alert. At baseline orientation, motor grossly intact Skin: Skin is warm.  No rashes, other new lesions, no LE edema Psychiatric: Pt behavior is normal without agitation except 1+ nervous, irritable and somewhat argumentative, mild dysphoric No other exam findings Lab Results  Component Value Date   WBC 10.2 04/09/2018   HGB 12.1 04/09/2018   HCT 36.6 04/09/2018   PLT 262 04/09/2018   GLUCOSE 99 04/09/2018   CHOL 184 11/01/2016   TRIG 99.0 11/01/2016   HDL 90.60 11/01/2016   LDLDIRECT 169.8 11/19/2008   LDLCALC 73 11/01/2016   ALT 27 04/09/2018   AST 64 (H) 04/09/2018   NA 134 (L) 04/09/2018   K 4.4 04/09/2018   CL 100 04/09/2018   CREATININE 0.67 04/09/2018   BUN 5 (L) 04/09/2018   CO2 22 04/09/2018   TSH 2.21 11/01/2016   INR 1.0 ratio 11/19/2008       Assessment & Plan:

## 2018-04-11 NOTE — Assessment & Plan Note (Signed)
Likely ETOH related, MRI negative for stroke, tumor; d/w pt no driving for 6 months,  to f/u any worsening symptoms or concerns

## 2018-04-11 NOTE — Assessment & Plan Note (Signed)
Suspect contusion with swelling, for film r/o fx, pain control

## 2018-04-11 NOTE — Assessment & Plan Note (Signed)
D/w pt, to further abstain from all ETOH, gave refill librium prn, refer to [facility] for rehab

## 2018-04-11 NOTE — Patient Instructions (Signed)
Please take all new medication as prescribed - the pain medication  Please continue all other medications as before, and refill was done for the librium  Please have the pharmacy call with any other refills you may need.  Please keep your appointments with your specialists as you may have planned  You will be contacted regarding the referral for: [facility]  Please go to the XRAY Department in the Basement (go straight as you get off the elevator) for the x-ray testing  You will be contacted by phone if any changes need to be made immediately.  Otherwise, you will receive a letter about your results with an explanation, but please check with MyChart first.  Please remember to sign up for MyChart if you have not done so, as this will be important to you in the future with finding out test results, communicating by private email, and scheduling acute appointments online when needed.  Please return in 3 months, or sooner if needed

## 2018-04-26 ENCOUNTER — Telehealth: Payer: Self-pay

## 2018-04-26 MED ORDER — SUMATRIPTAN SUCCINATE 100 MG PO TABS: 100.0000 mg | ORAL_TABLET | ORAL | 2 refills | Status: DC | PRN

## 2018-04-26 NOTE — Telephone Encounter (Signed)
Pt already has referral from 3/22.  This may take some time to arrange due to the pandemic

## 2018-04-26 NOTE — Telephone Encounter (Signed)
Copied from [place] [id number]. Topic: General - Inquiry >> Apr 26, 2018  9:41 AM Virl Axe D wrote: Reason for CRM: Pt stated Dr. Jenny Reichmann referred her to a Neurologist due to her constant headaches. Her appointment is not until 06/21/18 and pt is still having headaches. She would like to know if Dr. Jenny Reichmann could send something to her pharmacy until she is able to see the neurologist. Please advise.  [facility] [phone] - [place], [place] - [address] AT [address] [phone] (Phone) [phone] (Fax)

## 2018-04-26 NOTE — Telephone Encounter (Signed)
Please advise on medication question.

## 2018-04-26 NOTE — Telephone Encounter (Signed)
Ok for imitrex prn

## 2018-04-26 NOTE — Telephone Encounter (Signed)
Called pt, LVM with details below  

## 2018-04-26 NOTE — Addendum Note (Signed)
Addended by: Biagio Borg on: 04/26/2018 02:17 PM   Modules accepted: Orders

## 2018-05-22 ENCOUNTER — Telehealth: Payer: Self-pay | Admitting: Neurology

## 2018-05-22 NOTE — Telephone Encounter (Signed)
Pt has consented to a Virtual Visit and for the insurance to be billed as such. Cell phone number has been confirmed with pt.

## 2018-05-22 NOTE — Telephone Encounter (Signed)
I called and left a VM for patient asking if she would like to move her appt from 06/21/18 to Wednesday, 05/24/18. Please reschedule if she would like and also confirm her email address.

## 2018-05-25 ENCOUNTER — Ambulatory Visit (INDEPENDENT_AMBULATORY_CARE_PROVIDER_SITE_OTHER): Payer: Federal, State, Local not specified - PPO | Admitting: Licensed Clinical Social Worker

## 2018-05-25 DIAGNOSIS — F4322 Adjustment disorder with anxiety: Secondary | ICD-10-CM

## 2018-05-31 ENCOUNTER — Telehealth: Payer: Self-pay | Admitting: Internal Medicine

## 2018-05-31 NOTE — Telephone Encounter (Signed)
Copied from [place] [phone]. Topic: Quick Communication - Rx Refill/Question >> May 31, 2018  6:41 PM Nils Flack, Marland Kitchen wrote: Medication: SUMAtriptan (IMITREX) 100 MG tablet  Has the patient contacted their pharmacy? Yes.   (Agent: If no, request that the patient contact the pharmacy for the refill.) (Agent: If yes, when and what did the pharmacy advise?)  Preferred Pharmacy (with phone number or street name): [facility]   Agent: Please be advised that RX refills may take up to 3 business days. We ask that you follow-up with your pharmacy.

## 2018-06-01 MED ORDER — SUMATRIPTAN SUCCINATE 100 MG PO TABS: 100.0000 mg | ORAL_TABLET | ORAL | 2 refills | Status: DC | PRN

## 2018-06-14 ENCOUNTER — Telehealth: Payer: Self-pay | Admitting: Internal Medicine

## 2018-06-14 DIAGNOSIS — R519 Headache, unspecified: Secondary | ICD-10-CM

## 2018-06-14 NOTE — Telephone Encounter (Signed)
Copied from [place] [phone]. Topic: Quick Communication - Rx Refill/Question >> Jun 14, 2018  4:14 PM Margot Ables wrote: Medication: sumatriptan - pt states that she has been taking the sumatriptan as written on the box but only #10 per month. She had to pay $177 out of pocket to get last refill. She said that she has no refills left. She said the headache is constant and eases some with the sumatriptan with tylenol - pt described as a sledgehammer banging on her head all day long - pt said she needs to be seen by someone and [facility] called to change her 6/3 appt to 6/9 as a telephone visit and she cancelled because she doesn't feel it is valid - pt feels she needs an in office appt for evaluation  Pt noted at end of call she has a dent in the back of her head. She also said has some short term memory loss.   Preferred Pharmacy (with phone number or street name): [facility] [phone] - [place], [place] - [address] [phone] (Phone)   [phone] (Fax)

## 2018-06-15 ENCOUNTER — Telehealth: Payer: Self-pay | Admitting: Neurology

## 2018-06-15 NOTE — Telephone Encounter (Signed)
Patient also stated that her husband had to pay $177 for the third refill. She also fears going to the ER because of COVID 19

## 2018-06-15 NOTE — Telephone Encounter (Signed)
From the note, I think the patient is wanting to see neurology sooner than June 9  I will try to refer to [facility] in case this can be done

## 2018-06-15 NOTE — Telephone Encounter (Signed)
Pt has called and wanted to confirm her doxy.me vv for 06-09. Pt has asked that the link be resent since she did not previously get it through her email. Pt has given verbal consent for her insurance to be filled for the doxy.me vv  Pt understands that although there may be some limitations with this type of visit, we will take all precautions to reduce any security or privacy concerns.  Pt understands that this will be treated like an in office visit and we will file with pt's insurance, and there may be a patient responsible charge related to this service. *e-mail sent*

## 2018-06-15 NOTE — Telephone Encounter (Signed)
I spoke to pt regarding referral - can you please call her asap regarding medication

## 2018-06-15 NOTE — Telephone Encounter (Signed)
I have spoke to the patient, she stated that insurance only covers 10 pills with 1 refill a month. She states that she is scared to go without her medication due to her migranies. I suggested that she speak with her insurance company and ask what meds they do cover that are similar to sumatriptan and would cover a month supply. She expressed understanding and will call the office back once she has found out her med options.

## 2018-06-15 NOTE — Telephone Encounter (Signed)
Pt has been referred to Neurology on urgent basis, hopefully to be seen soon

## 2018-06-15 NOTE — Telephone Encounter (Signed)
Please refer to previous msg. Pt is to contact insurance to see what else they cover.

## 2018-06-15 NOTE — Telephone Encounter (Signed)
Patient called to say that she is having a constant headache and need an Rx for SUMAtriptan (IMITREX) 100 MG tablet or if Dr Jenny Reichmann can please send an Rx for something similar sent to the office. Per patient she is having this constant pain and need some type of relief. She is also asking for a call back from Dr Jenny Reichmann or call the pharmacy to override so that she can get help. Ph# [phone]

## 2018-06-16 NOTE — Telephone Encounter (Signed)
Pt has been informed and expressed understanding.  

## 2018-06-21 ENCOUNTER — Ambulatory Visit: Payer: Federal, State, Local not specified - PPO | Admitting: Neurology

## 2018-06-27 ENCOUNTER — Encounter

## 2018-06-27 ENCOUNTER — Ambulatory Visit (INDEPENDENT_AMBULATORY_CARE_PROVIDER_SITE_OTHER): Payer: Federal, State, Local not specified - PPO | Admitting: Neurology

## 2018-06-27 ENCOUNTER — Other Ambulatory Visit: Payer: Self-pay

## 2018-06-27 ENCOUNTER — Encounter: Payer: Self-pay | Admitting: Neurology

## 2018-06-27 DIAGNOSIS — R569 Unspecified convulsions: Secondary | ICD-10-CM

## 2018-06-27 DIAGNOSIS — IMO0002 Reserved for concepts with insufficient information to code with codable children: Secondary | ICD-10-CM | POA: Insufficient documentation

## 2018-06-27 DIAGNOSIS — G43709 Chronic migraine without aura, not intractable, without status migrainosus: Secondary | ICD-10-CM

## 2018-06-27 MED ORDER — ONDANSETRON 4 MG PO TBDP: 4.0000 mg | ORAL_TABLET | Freq: Three times a day (TID) | ORAL | 6 refills | Status: AC | PRN

## 2018-06-27 MED ORDER — TOPIRAMATE 100 MG PO TABS: 100.0000 mg | ORAL_TABLET | Freq: Two times a day (BID) | ORAL | 11 refills | Status: AC

## 2018-06-27 MED ORDER — SUMATRIPTAN SUCCINATE 100 MG PO TABS: 100.0000 mg | ORAL_TABLET | ORAL | 5 refills | Status: AC | PRN

## 2018-06-27 NOTE — Progress Notes (Signed)
PATIENT: Shannon [name] DOB: 1970-09-26  Virtual Visit via video  I connected with Ngoc Jethro Bolus on 06/27/18 at  by video and verified that I am speaking with the correct person using two identifiers.   I discussed the limitations, risks, security and privacy concerns of performing an evaluation and management service by video and the availability of in person appointments. I also discussed with the patient that there may be a patient responsible charge related to this service. The patient expressed understanding and agreed to proceed.  HISTORICAL  Shannon [name] is a 48 year old female, seen in request by her primary care doctor Cathlean Cower for evaluation of seizure  I have reviewed and summarized the referring note from the referring physician.  She had a history of migraine headaches, had 2 seizure on April 09, 2018  The first 1 happened around 10 AM, on April 09, 2018 she was watching TV with her husband, she fell into sleep, then she had 1 seizure, generalized tonic-clonic seizure, body went stiff, lasting for few minutes, no tongue biting, no incontinence, paramedic was called, she was confused, but gradually recovered, she was not taking to the emergency room on the first episode  The same day, around 2 PM, she suddenly slumped over, had another generalized tonic-clonic seizure, drooling, foaming coming out of her mouth, lasting for few minutes,  Paramedic was called second time, was taken to the emergency room  I personally reviewed MRI of the brain with without contrast on April 09, 2018 that was normal  Laboratory evaluations showed normal CBC hemoglobin of 12.1, CMP, sodium was mildly decreased 134  I reviewed Dr. Cecilio Asper evaluation on April 11, 2018, there was a mention of possible alcohol-related seizure, she was given a prescription of Librium, she is no longer taking it, she denies a history of heavy alcohol use  She also complains of frequent migraine  headaches, only had occasional headache in the past, getting worse since March 22, almost daily basis, has been taking frequent over-the-counter medication with limited help   Observations/Objective: I have reviewed problem lists, medications, allergies.  Awake, alert, oriented to history taking and casual conversation.  Facial were symmetric, moving 4 extremities without difficulty, ambulate without difficulty  Assessment and Plan:  Seizure on April 09 2018 Chronic migraine   EEG  Topamax 100mg  bid   Imitrex, zofran as needed for migraine   No driving until seizure-free for 6 months   Follow Up Instructions:  Return to clinic in 3 months with nurse practitioner Judson Roch  I discussed the assessment and treatment plan with the patient. The patient was provided an opportunity to ask questions and all were answered. The patient agreed with the plan and demonstrated an understanding of the instructions.   The patient was advised to call back or seek an in-person evaluation if the symptoms worsen or if the condition fails to improve as anticipated.  I provided 30 minutes of non-face-to-face time during this encounter.  REVIEW OF SYSTEMS: Full 14 system review of systems performed and notable only for as above. All other review of systems were negative.  ALLERGIES: Allergies  Allergen Reactions  . Bee Venom Shortness Of Breath    HOME MEDICATIONS: Current Outpatient Medications  Medication Sig Dispense Refill  . acetaminophen (TYLENOL) 500 MG tablet Take 500 mg by mouth every 6 (six) hours as needed for mild pain.     . chlordiazePOXIDE (LIBRIUM) 25 MG capsule 25 mg by mouth twice per day 30  capsule 1  . metoCLOPramide (REGLAN) 5 MG tablet Take 1 tablet (5 mg total) by mouth 4 (four) times daily. 2 tablet 0  . SUMAtriptan (IMITREX) 100 MG tablet Take 1 tablet (100 mg total) by mouth every 2 (two) hours as needed for migraine or headache. May repeat in 2 hours if headache persists or  recurs. 10 tablet 2  . traMADol (ULTRAM) 50 MG tablet Take 1 tablet (50 mg total) by mouth every 6 (six) hours as needed. 30 tablet 0   No current facility-administered medications for this visit.     PAST MEDICAL HISTORY: Past Medical History:  Diagnosis Date  . Allergic rhinitis   . ANEMIA-IRON DEFICIENCY 12/27/2006   Qualifier: Diagnosis of  By: Jenny Reichmann MD, [place] 12/27/2006   Qualifier: Diagnosis of  By: Jenny Reichmann MD, [place] sting allergy   . Depressive disorder, not elsewhere classified 12/27/2006   Centricity Description: DEPRESSION Qualifier: Diagnosis of  By: Jenny Reichmann MD, [place]  Centricity Description: DEPRESSIVE DISORDER Qualifier: Diagnosis of  By: Jenny Reichmann MD, [place] Or[place]   . SINUSITIS, CHRONIC 09/11/2008   Qualifier: Diagnosis of  By: Jenny Reichmann MD, [place]     PAST SURGICAL HISTORY: No past surgical history on file.  FAMILY HISTORY: Family History  Problem Relation Age of Onset  . Lung cancer Mother   . Colon cancer Father     SOCIAL HISTORY:   Social History   Socioeconomic History  . Marital status: Married    Spouse name: Not on file  . Number of children: Not on file  . Years of education: Not on file  . Highest education level: Not on file  Occupational History  . Not on file  Social Needs  . Financial resource strain: Not on file  . Food insecurity:    Worry: Not on file    Inability: Not on file  . Transportation needs:    Medical: Not on file    Non-medical: Not on file  Tobacco Use  . Smoking status: Current Every Day Smoker    Packs/day: 1.00    Types: Cigarettes  . Smokeless tobacco: Never Used  Substance and Sexual Activity  . Alcohol use: Yes    Comment: 4/week  . Drug use: No  . Sexual activity: Not on file  Lifestyle  . Physical activity:    Days per week: Not on file    Minutes per session: Not on file  . Stress: Not on file  Relationships  . Social connections:    Talks on phone: Not on file    Gets together: Not on file     Attends religious service: Not on file    Active member of club or organization: Not on file    Attends meetings of clubs or organizations: Not on file    Relationship status: Not on file  . Intimate partner violence:    Fear of current or ex partner: Not on file    Emotionally abused: Not on file    Physically abused: Not on file    Forced sexual activity: Not on file  Other Topics Concern  . Not on file  Social History Narrative  . Not on file    Marcial Pacas, M.D. Ph.D.  [facility] [address] [place], [place] [address] Ph: [phone] Fax: [phone]  CC: Biagio Borg, MD

## 2018-07-12 ENCOUNTER — Ambulatory Visit (INDEPENDENT_AMBULATORY_CARE_PROVIDER_SITE_OTHER): Payer: Federal, State, Local not specified - PPO | Admitting: Internal Medicine

## 2018-07-12 DIAGNOSIS — E785 Hyperlipidemia, unspecified: Secondary | ICD-10-CM

## 2018-07-12 DIAGNOSIS — E538 Deficiency of other specified B group vitamins: Secondary | ICD-10-CM

## 2018-07-12 DIAGNOSIS — G43709 Chronic migraine without aura, not intractable, without status migrainosus: Secondary | ICD-10-CM

## 2018-07-12 DIAGNOSIS — J301 Allergic rhinitis due to pollen: Secondary | ICD-10-CM

## 2018-07-12 DIAGNOSIS — IMO0002 Reserved for concepts with insufficient information to code with codable children: Secondary | ICD-10-CM

## 2018-07-12 DIAGNOSIS — E559 Vitamin D deficiency, unspecified: Secondary | ICD-10-CM | POA: Diagnosis not present

## 2018-07-12 DIAGNOSIS — E611 Iron deficiency: Secondary | ICD-10-CM | POA: Diagnosis not present

## 2018-07-12 DIAGNOSIS — J4520 Mild intermittent asthma, uncomplicated: Secondary | ICD-10-CM | POA: Diagnosis not present

## 2018-07-12 DIAGNOSIS — Z0001 Encounter for general adult medical examination with abnormal findings: Secondary | ICD-10-CM

## 2018-07-12 NOTE — Progress Notes (Signed)
Patient ID: Shannon [name], female   DOB: [birth year], 48 y.o.   MRN: [id number]  Virtual Visit via Video Note  I connected with Algie Jethro Bolus on 07/12/18 at  2:00 PM EDT by a video enabled telemedicine application and verified that I am speaking with the correct person using two identifiers.  Location: Patient: at home Provider: at office   I discussed the limitations of evaluation and management by telemedicine and the availability of in person appointments. The patient expressed understanding and agreed to proceed.  History of Present Illness: Here for wellness and f/u;  Overall doing ok;  Pt denies Chest pain, worsening SOB, DOE, wheezing, orthopnea, PND, worsening LE edema, palpitations, dizziness or syncope.  Pt denies neurological change such as new facial or extremity weakness.  Pt denies polydipsia, polyuria, or low sugar symptoms. Pt states overall good compliance with treatment and medications, good tolerability, and has been trying to follow appropriate diet.  Pt denies worsening depressive symptoms, suicidal ideation or panic. No fever, night sweats, wt loss, loss of appetite, or other constitutional symptoms.  Pt states good ability with ADL's, has low fall risk, home safety reviewed and adequate, no other significant changes in hearing or vision, and only occasionally active with exercise. Also c/o 1 mo marked increase in migrainous HA typical for her, mod to severe at times, bitemporal sometimes, worse with exertion, better to rest and quit, nothing else seems to trigger.  Admits to not taking the topamax, ran out and wasn't really sure what it was for. Also, Does have several wks ongoing nasal allergy symptoms with clearish congestion, itch and sneezing, without fever, pain, ST, cough, swelling or wheezing. Past Medical History:  Diagnosis Date  . Allergic rhinitis   . ANEMIA-IRON DEFICIENCY 12/27/2006   Qualifier: Diagnosis of  By: Jenny Reichmann MD, [place] 12/27/2006   Qualifier: Diagnosis of  By: Jenny Reichmann MD, [place] sting allergy   . Depressive disorder, not elsewhere classified 12/27/2006   Centricity Description: DEPRESSION Qualifier: Diagnosis of  By: Jenny Reichmann MD, Hunt Oris  Centricity Description: DEPRESSIVE DISORDER Qualifier: Diagnosis of  By: Jenny Reichmann MD, Hunt Oris   . SINUSITIS, CHRONIC 09/11/2008   Qualifier: Diagnosis of  By: Jenny Reichmann MD, Hunt Oris    No past surgical history on file.  reports that she has been smoking cigarettes. She has been smoking about 1.00 pack per day. She has never used smokeless tobacco. She reports current alcohol use. She reports that she does not use drugs. family history includes Colon cancer in her father; Lung cancer in her mother. Allergies  Allergen Reactions  . Bee Venom Shortness Of Breath   Current Outpatient Medications on File Prior to Visit  Medication Sig Dispense Refill  . acetaminophen (TYLENOL) 500 MG tablet Take 500 mg by mouth every 6 (six) hours as needed for mild pain.     . chlordiazePOXIDE (LIBRIUM) 25 MG capsule 25 mg by mouth twice per day 30 capsule 1  . metoCLOPramide (REGLAN) 5 MG tablet Take 1 tablet (5 mg total) by mouth 4 (four) times daily. 2 tablet 0  . ondansetron (ZOFRAN ODT) 4 MG disintegrating tablet Take 1 tablet (4 mg total) by mouth every 8 (eight) hours as needed. 20 tablet 6  . SUMAtriptan (IMITREX) 100 MG tablet Take 1 tablet (100 mg total) by mouth every 2 (two) hours as needed for migraine or headache. May repeat in 2 hours if headache persists or recurs. 10 tablet  5  . topiramate (TOPAMAX) 100 MG tablet Take 1 tablet (100 mg total) by mouth 2 (two) times daily. 60 tablet 11  . traMADol (ULTRAM) 50 MG tablet Take 1 tablet (50 mg total) by mouth every 6 (six) hours as needed. 30 tablet 0   No current facility-administered medications on file prior to visit.     Observations/Objective: Alert, NAD, appropriate mood and affect, resps normal, cn 2-12 intact, moves all 4s, no visible rash or  swelling  Lab Results  Component Value Date   WBC 10.2 04/09/2018   HGB 12.1 04/09/2018   HCT 36.6 04/09/2018   PLT 262 04/09/2018   GLUCOSE 99 04/09/2018   CHOL 184 11/01/2016   TRIG 99.0 11/01/2016   HDL 90.60 11/01/2016   LDLDIRECT 169.8 11/19/2008   LDLCALC 73 11/01/2016   ALT 27 04/09/2018   AST 64 (H) 04/09/2018   NA 134 (L) 04/09/2018   K 4.4 04/09/2018   CL 100 04/09/2018   CREATININE 0.67 04/09/2018   BUN 5 (L) 04/09/2018   CO2 22 04/09/2018   TSH 2.21 11/01/2016   INR 1.0 ratio 11/19/2008   Assessment and Plan: See notes  Follow Up Instructions: See notes   I discussed the assessment and treatment plan with the patient. The patient was provided an opportunity to ask questions and all were answered. The patient agreed with the plan and demonstrated an understanding of the instructions.   The patient was advised to call back or seek an in-person evaluation if the symptoms worsen or if the condition fails to improve as anticipated.   Cathlean Cower, MD

## 2018-07-16 ENCOUNTER — Encounter: Payer: Self-pay | Admitting: Internal Medicine

## 2018-07-16 NOTE — Assessment & Plan Note (Signed)
/  Mild to mod, for allegra prn,  to f/u any worsening symptoms or concerns

## 2018-07-16 NOTE — Assessment & Plan Note (Signed)
stable overall by history and exam, recent data reviewed with pt, and pt to continue medical treatment as before,  to f/u any worsening symptoms or concerns  

## 2018-07-16 NOTE — Assessment & Plan Note (Addendum)
Mod to severe much more frequent recent, needs to start topamax and explained the concept of prevention,  to f/u any worsening symptoms or concerns, f/u neurology as planned  In addition to the time spent performing CPE, I spent an additional 25 minutes face to face,in which greater than 50% of this time was spent in counseling and coordination of care for patient's acute illness as documented, including the differential dx, treatment, further evaluation and other management of chronic migraine, allergies, asthma, HLD

## 2018-07-16 NOTE — Patient Instructions (Signed)
Please continue all other medications as before, including restart the topamax for migraine prevention  Please have the pharmacy call with any other refills you may need.  Please continue your efforts at being more active, low cholesterol diet, and weight control.  You are otherwise up to date with prevention measures today.  Please keep your appointments with your specialists as you may have planned  Please go to the LAB in the Basement (turn left off the elevator) for the tests to be done today  You will be contacted by phone if any changes need to be made immediately.  Otherwise, you will receive a letter about your results with an explanation, but please check with MyChart first.  Please remember to sign up for MyChart if you have not done so, as this will be important to you in the future with finding out test results, communicating by private email, and scheduling acute appointments online when needed.  Please return in 1 year for your yearly visit, or sooner if needed

## 2018-07-16 NOTE — Assessment & Plan Note (Signed)
Mod to severe, declines statin, for lower chol diet

## 2018-07-16 NOTE — Assessment & Plan Note (Signed)

## 2018-07-17 ENCOUNTER — Other Ambulatory Visit (INDEPENDENT_AMBULATORY_CARE_PROVIDER_SITE_OTHER): Payer: Federal, State, Local not specified - PPO

## 2018-07-17 DIAGNOSIS — E538 Deficiency of other specified B group vitamins: Secondary | ICD-10-CM | POA: Diagnosis not present

## 2018-07-17 DIAGNOSIS — E559 Vitamin D deficiency, unspecified: Secondary | ICD-10-CM

## 2018-07-17 DIAGNOSIS — Z0001 Encounter for general adult medical examination with abnormal findings: Secondary | ICD-10-CM

## 2018-07-17 DIAGNOSIS — E611 Iron deficiency: Secondary | ICD-10-CM

## 2018-07-17 LAB — TSH: TSH: 2.46 u[IU]/mL (ref 0.35–4.50)

## 2018-07-17 LAB — BASIC METABOLIC PANEL
BUN: 6 mg/dL (ref 6–23)
CO2: 18 mEq/L — ABNORMAL LOW (ref 19–32)
Calcium: 8.9 mg/dL (ref 8.4–10.5)
Chloride: 95 mEq/L — ABNORMAL LOW (ref 96–112)
Creatinine, Ser: 0.65 mg/dL (ref 0.40–1.20)
GFR: 96.59 mL/min (ref >60.00)
Glucose, Bld: 74 mg/dL (ref 70–99)
Potassium: 3.9 mEq/L (ref 3.5–5.1)
Sodium: 125 mEq/L — ABNORMAL LOW (ref 135–145)

## 2018-07-17 LAB — CBC WITH DIFFERENTIAL/PLATELET
Basophils Absolute: 0.1 10*3/uL (ref 0.0–0.1)
Basophils Relative: 1.1 % (ref 0.0–3.0)
Eosinophils Absolute: 0.2 10*3/uL (ref 0.0–0.7)
Eosinophils Relative: 1.9 % (ref 0.0–5.0)
HCT: 36.6 % (ref 36.0–46.0)
Hemoglobin: 12.5 g/dL (ref 12.0–15.0)
Lymphocytes Relative: 31.9 % (ref 12.0–46.0)
Lymphs Abs: 2.9 10*3/uL (ref 0.7–4.0)
MCHC: 34.1 g/dL (ref 30.0–36.0)
MCV: 98.1 fl (ref 78.0–100.0)
Monocytes Absolute: 0.8 10*3/uL (ref 0.1–1.0)
Monocytes Relative: 8.7 % (ref 3.0–12.0)
Neutro Abs: 5.1 10*3/uL (ref 1.4–7.7)
Neutrophils Relative %: 56.4 % (ref 43.0–77.0)
Platelets: 328 10*3/uL (ref 150.0–400.0)
RBC: 3.73 Mil/uL — ABNORMAL LOW (ref 3.87–5.11)
RDW: 16.2 % — ABNORMAL HIGH (ref 11.5–15.5)
WBC: 9.1 10*3/uL (ref 4.0–10.5)

## 2018-07-17 LAB — IBC PANEL
Iron: 55 ug/dL (ref 42–145)
Saturation Ratios: 10.9 % — ABNORMAL LOW (ref 20.0–50.0)
Transferrin: 361 mg/dL — ABNORMAL HIGH (ref 212.0–360.0)

## 2018-07-17 LAB — LIPID PANEL
Cholesterol: 199 mg/dL (ref 0–200)
HDL: 71.4 mg/dL (ref >39.00)
LDL Cholesterol: 101 mg/dL — ABNORMAL HIGH (ref 0–99)
NonHDL: 127.46
Total CHOL/HDL Ratio: 3
Triglycerides: 132 mg/dL (ref 0.0–149.0)
VLDL: 26.4 mg/dL (ref 0.0–40.0)

## 2018-07-17 LAB — URINALYSIS, ROUTINE W REFLEX MICROSCOPIC
Bilirubin Urine: NEGATIVE
Hgb urine dipstick: NEGATIVE
Ketones, ur: NEGATIVE
Nitrite: NEGATIVE
Specific Gravity, Urine: <=1.005 — AB (ref 1.000–1.030)
Total Protein, Urine: NEGATIVE
Urine Glucose: NEGATIVE
Urobilinogen, UA: 0.2 (ref 0.0–1.0)
pH: 6 (ref 5.0–8.0)

## 2018-07-17 LAB — HEPATIC FUNCTION PANEL
ALT: 32 U/L (ref 0–35)
AST: 43 U/L — ABNORMAL HIGH (ref 0–37)
Albumin: 4.2 g/dL (ref 3.5–5.2)
Alkaline Phosphatase: 78 U/L (ref 39–117)
Bilirubin, Direct: 0 mg/dL (ref 0.0–0.3)
Total Bilirubin: 0.2 mg/dL (ref 0.2–1.2)
Total Protein: 7.4 g/dL (ref 6.0–8.3)

## 2018-07-17 LAB — VITAMIN D 25 HYDROXY (VIT D DEFICIENCY, FRACTURES): VITD: 11.19 ng/mL — ABNORMAL LOW (ref 30.00–100.00)

## 2018-07-17 LAB — VITAMIN B12: Vitamin B-12: 344 pg/mL (ref 211–911)

## 2018-07-18 ENCOUNTER — Telehealth: Payer: Self-pay

## 2018-07-18 NOTE — Telephone Encounter (Signed)
But has she been drinking too MUCH fluids (that's how the sodium goes down if a person drinks fluids such as 1-2 liters or more per day)  ?

## 2018-07-18 NOTE — Telephone Encounter (Signed)
I have spoke with pt in regard to her sodium level. She stated that she has not been having any vomiting or diarrhea and she has been drinking an adequate fluid intake. Please advise.

## 2018-07-18 NOTE — Telephone Encounter (Signed)
-----   Message from Biagio Borg, MD sent at 07/18/2018  7:58 AM EDT ----- Shannon [name] to contact pt  Sodium level is quite low, though not quite dangerous  Has she been ill with vomiting, diarrhea or does she drink a lot of oral fluids (maybe too much fluids?) every day?

## 2018-07-19 ENCOUNTER — Telehealth: Payer: Self-pay

## 2018-07-19 ENCOUNTER — Other Ambulatory Visit: Payer: Self-pay | Admitting: Internal Medicine

## 2018-07-19 ENCOUNTER — Encounter: Payer: Self-pay | Admitting: Internal Medicine

## 2018-07-19 ENCOUNTER — Ambulatory Visit: Payer: Federal, State, Local not specified - PPO | Admitting: Neurology

## 2018-07-19 ENCOUNTER — Other Ambulatory Visit: Payer: Self-pay

## 2018-07-19 DIAGNOSIS — R569 Unspecified convulsions: Secondary | ICD-10-CM | POA: Diagnosis not present

## 2018-07-19 DIAGNOSIS — G43709 Chronic migraine without aura, not intractable, without status migrainosus: Secondary | ICD-10-CM

## 2018-07-19 DIAGNOSIS — E871 Hypo-osmolality and hyponatremia: Secondary | ICD-10-CM

## 2018-07-19 DIAGNOSIS — IMO0002 Reserved for concepts with insufficient information to code with codable children: Secondary | ICD-10-CM

## 2018-07-19 MED ORDER — VITAMIN D (ERGOCALCIFEROL) 1.25 MG (50000 UNIT) PO CAPS: 50000.0000 [IU] | ORAL_CAPSULE | ORAL | 0 refills | Status: AC

## 2018-07-19 NOTE — Telephone Encounter (Signed)
Called pt, LVM.   CRM created.  

## 2018-07-19 NOTE — Telephone Encounter (Signed)
Pt stated that she hasn't been over drinking.

## 2018-07-19 NOTE — Telephone Encounter (Signed)
-----   Message from Biagio Borg, MD sent at 07/19/2018 12:41 PM EDT ----- Left message on MyChart, pt to cont same tx except  The test results show that your current treatment is OK, except the Sodium level is quite low, the Vitamin D level is low, and small amount of blood is seen in the urine testing  We need to: 1)  Please take Vitamin D 50000 units weekly for 12 weeks, then plan to change to OTC Vitamin D3 at 2000 units per day, indefinitely. 2)  Repeat the sodium level testing (I will place the orders) 3)  Shirron to ask patient if menses could be the cause of the blood in the urine.    Shirron to please inform pt, I will do rx, and orders, and let me know if menses could account for the blood in the urine

## 2018-07-27 ENCOUNTER — Telehealth: Payer: Self-pay | Admitting: *Deleted

## 2018-07-27 NOTE — Procedures (Signed)
   HISTORY: 47 years old female, presented with seizure  TECHNIQUE:  This is a routine 16 channel EEG recording with one channel devoted to a limited EKG recording.  It was performed during wakefulness, drowsiness and asleep.  Hyperventilation and photic stimulation were performed as activating procedures.  There are minimum muscle and movement artifact noted.  Upon maximum arousal, posterior dominant waking rhythm consistent of rhythmic alpha range activity, with frequency of 9 hz. Activities are symmetric over the bilateral posterior derivations and attenuated with eye opening.  Hyperventilation produced mild/moderate buildup with higher amplitude and the slower activities noted.  Photic stimulation did not alter the tracing.  During EEG recording, patient developed drowsiness and no deeper stage of sleep was achieved.  During EEG recording, there was no epileptiform discharge noted.  EKG demonstrate sinus rhythm, with heart rate of 96 beats/minutes  CONCLUSION: This is a  normal awake EEG.  There is no electrodiagnostic evidence of epileptiform discharge.  Marcial Pacas, M.D. Ph.D.  [facility] [place], [place] [address] Phone: [phone] Fax:      [phone]

## 2018-07-27 NOTE — Telephone Encounter (Signed)
-----   Message from Marcial Pacas, MD sent at 07/27/2018  4:48 PM EDT ----- Please call patient for normal EEG

## 2018-07-27 NOTE — Telephone Encounter (Signed)
I have spoken with the patient.  She is aware that her EEG is normal.  She understands to continue her medications as prescribed.

## 2018-07-27 NOTE — Progress Notes (Signed)
Ok to let pt know the EEG was negative for siezue like activity, no further testing needed based on this

## 2018-07-28 ENCOUNTER — Telehealth: Payer: Self-pay

## 2018-07-28 NOTE — Telephone Encounter (Signed)
-----   Message from Biagio Borg, MD sent at 07/27/2018  4:51 PM EDT -----   ----- Message ----- From: Marcial Pacas, MD Sent: 07/27/2018   4:48 PM EDT To: Biagio Borg, MD

## 2018-07-28 NOTE — Telephone Encounter (Signed)
Pt has been informed of results and expressed understanding.  °

## 2018-08-07 ENCOUNTER — Other Ambulatory Visit: Payer: Federal, State, Local not specified - PPO

## 2018-08-10 DIAGNOSIS — K029 Dental caries, unspecified: Secondary | ICD-10-CM | POA: Diagnosis not present

## 2018-08-11 ENCOUNTER — Encounter: Payer: Self-pay | Admitting: Internal Medicine

## 2019-07-21 DIAGNOSIS — M25571 Pain in right ankle and joints of right foot: Secondary | ICD-10-CM | POA: Diagnosis not present

## 2019-07-21 DIAGNOSIS — S93409S Sprain of unspecified ligament of unspecified ankle, sequela: Secondary | ICD-10-CM | POA: Diagnosis not present

## 2019-07-21 DIAGNOSIS — F17210 Nicotine dependence, cigarettes, uncomplicated: Secondary | ICD-10-CM | POA: Diagnosis not present

## 2019-10-28 DIAGNOSIS — Z79899 Other long term (current) drug therapy: Secondary | ICD-10-CM | POA: Diagnosis not present

## 2019-10-28 DIAGNOSIS — R000 Tachycardia, unspecified: Secondary | ICD-10-CM | POA: Diagnosis not present

## 2019-10-28 DIAGNOSIS — M79604 Pain in right leg: Secondary | ICD-10-CM | POA: Diagnosis not present

## 2019-10-28 DIAGNOSIS — M7989 Other specified soft tissue disorders: Secondary | ICD-10-CM | POA: Diagnosis not present

## 2019-10-28 DIAGNOSIS — M79605 Pain in left leg: Secondary | ICD-10-CM | POA: Diagnosis not present

## 2019-11-03 ENCOUNTER — Emergency Department (HOSPITAL_COMMUNITY)
Admission: EM | Admit: 2019-11-03 | Discharge: 2019-11-03 | Disposition: A | Discharge status: Home / Self Care | Payer: Federal, State, Local not specified - PPO | Attending: Emergency Medicine | Admitting: Emergency Medicine

## 2019-11-03 ENCOUNTER — Encounter (HOSPITAL_COMMUNITY): Payer: Self-pay | Admitting: Emergency Medicine

## 2019-11-03 ENCOUNTER — Other Ambulatory Visit: Payer: Self-pay

## 2019-11-03 ENCOUNTER — Emergency Department (HOSPITAL_COMMUNITY): Payer: Federal, State, Local not specified - PPO

## 2019-11-03 DIAGNOSIS — R2243 Localized swelling, mass and lump, lower limb, bilateral: Secondary | ICD-10-CM | POA: Diagnosis not present

## 2019-11-03 DIAGNOSIS — F17210 Nicotine dependence, cigarettes, uncomplicated: Secondary | ICD-10-CM | POA: Diagnosis not present

## 2019-11-03 DIAGNOSIS — G629 Polyneuropathy, unspecified: Secondary | ICD-10-CM

## 2019-11-03 DIAGNOSIS — J45909 Unspecified asthma, uncomplicated: Secondary | ICD-10-CM | POA: Insufficient documentation

## 2019-11-03 DIAGNOSIS — M7989 Other specified soft tissue disorders: Secondary | ICD-10-CM | POA: Diagnosis not present

## 2019-11-03 DIAGNOSIS — R600 Localized edema: Secondary | ICD-10-CM | POA: Diagnosis not present

## 2019-11-03 DIAGNOSIS — R609 Edema, unspecified: Secondary | ICD-10-CM | POA: Diagnosis not present

## 2019-11-03 LAB — CBC
HCT: 34.8 % — ABNORMAL LOW (ref 36.0–46.0)
Hemoglobin: 11.7 g/dL — ABNORMAL LOW (ref 12.0–15.0)
MCH: 34.9 pg — ABNORMAL HIGH (ref 26.0–34.0)
MCHC: 33.6 g/dL (ref 30.0–36.0)
MCV: 103.9 fL — ABNORMAL HIGH (ref 80.0–100.0)
Platelets: 487 10*3/uL — ABNORMAL HIGH (ref 150–400)
RBC: 3.35 MIL/uL — ABNORMAL LOW (ref 3.87–5.11)
RDW: 14.4 % (ref 11.5–15.5)
WBC: 12.1 10*3/uL — ABNORMAL HIGH (ref 4.0–10.5)
nRBC: 0.2 % (ref 0.0–0.2)

## 2019-11-03 LAB — COMPREHENSIVE METABOLIC PANEL
ALT: 23 U/L (ref 0–44)
AST: 23 U/L (ref 15–41)
Albumin: 3.7 g/dL (ref 3.5–5.0)
Alkaline Phosphatase: 46 U/L (ref 38–126)
Anion gap: 9 (ref 5–15)
BUN: 16 mg/dL (ref 6–20)
CO2: 27 mmol/L (ref 22–32)
Calcium: 8.5 mg/dL — ABNORMAL LOW (ref 8.9–10.3)
Chloride: 103 mmol/L (ref 98–111)
Creatinine, Ser: 0.74 mg/dL (ref 0.44–1.00)
GFR, Estimated: >60 mL/min (ref >60)
Glucose, Bld: 73 mg/dL (ref 70–99)
Potassium: 4 mmol/L (ref 3.5–5.1)
Sodium: 139 mmol/L (ref 135–145)
Total Bilirubin: 0.5 mg/dL (ref 0.3–1.2)
Total Protein: 7.1 g/dL (ref 6.5–8.1)

## 2019-11-03 LAB — URINALYSIS, ROUTINE W REFLEX MICROSCOPIC
Bacteria, UA: NONE SEEN
Bilirubin Urine: NEGATIVE
Glucose, UA: NEGATIVE mg/dL
Hgb urine dipstick: NEGATIVE
Ketones, ur: NEGATIVE mg/dL
Nitrite: NEGATIVE
Protein, ur: NEGATIVE mg/dL
Specific Gravity, Urine: 1.005 (ref 1.005–1.030)
pH: 6 (ref 5.0–8.0)

## 2019-11-03 LAB — BRAIN NATRIURETIC PEPTIDE: B Natriuretic Peptide: 288.7 pg/mL — ABNORMAL HIGH (ref 0.0–100.0)

## 2019-11-03 LAB — D-DIMER, QUANTITATIVE: D-Dimer, Quant: 0.46 ug/mL-FEU (ref 0.00–0.50)

## 2019-11-03 MED ORDER — GABAPENTIN 100 MG PO CAPS
100.0000 mg | ORAL_CAPSULE | Freq: Three times a day (TID) | ORAL | 0 refills | Status: AC
Start: 2019-11-03 — End: 2019-11-10

## 2019-11-03 MED ORDER — VITAMIN B-12 1000 MCG PO TABS
1000.0000 ug | ORAL_TABLET | Freq: Every day | ORAL | 0 refills | Status: AC
Start: 2019-11-03 — End: 2019-11-10

## 2019-11-03 MED ORDER — THIAMINE HCL 100 MG PO TABS: 100.0000 mg | ORAL_TABLET | Freq: Every day | ORAL | 0 refills | Status: AC

## 2019-11-03 MED ORDER — KETOROLAC TROMETHAMINE 60 MG/2ML IM SOLN
30.0000 mg | Freq: Once | INTRAMUSCULAR | Status: AC
  Administered 2019-11-03: 30 mg via INTRAMUSCULAR
  Filled 2019-11-03: qty 2

## 2019-11-03 NOTE — ED Provider Notes (Signed)
[facility] DEPT Provider Note   CSN: [id number] Arrival date & time: 11/03/19  0110     History Chief Complaint  Patient presents with   Leg Swelling    Shannon [name] is a 49 y.o. female with a history of alcohol use disorder, asthma, tobacco use disorder who presents the emergency department with a chief complaint of bilateral leg pain and swelling.  The patient reports swelling to the bilateral lower legs that began 1 week ago.  She also reports that she has been having constant dull, achy pain in her bilateral lower legs will intermittently become sharp and shooting and radiate up into the legs, which has been going on for several weeks.  No known aggravating or alleviating factors.  No history of similar.  She had 2 days of double built this due to pain, shortness of breath, chest pain, cough, fever, chills, red streaking, warmth, purulent discharge, numbness, weakness.  She has been taking over-the-counter pain medication for her symptoms without improvement.  Reports that she has been following up with [facility] and thinks that they are in the process of setting her up for a cardiac echo?  And she is waiting for callback.  States that she had some kind of imaging recently she was told that her left ventricle may be enlarged.  She cannot recall specifically the name of the study, but thinks that was performed at [facility].  Per chart review, she has a history of alcohol use disorder.  Patient reports to me that she likes to drink beer.  She would not elaborate on quantity of alcohol.  No history of diabetes.  The history is provided by the patient and medical records. No language interpreter was used.       Past Medical History:  Diagnosis Date   Allergic rhinitis    ANEMIA-IRON DEFICIENCY 12/27/2006   Qualifier: Diagnosis of  By: Jenny Reichmann MD, Hunt Oris    ASTHMA 12/27/2006   Qualifier: Diagnosis of  By: Jenny Reichmann MD, Hunt Oris    Bee sting allergy     Depressive disorder, not elsewhere classified 12/27/2006   Centricity Description: DEPRESSION Qualifier: Diagnosis of  By: Jenny Reichmann MD, Hunt Oris  Centricity Description: DEPRESSIVE DISORDER Qualifier: Diagnosis of  By: Jenny Reichmann MD, [place], CHRONIC 09/11/2008   Qualifier: Diagnosis of  By: Jenny Reichmann MD, Hunt Oris     Patient Active Problem List   Diagnosis Date Noted   Chronic migraine 06/27/2018   Alcohol abuse 04/11/2018   Seizure ([place]) 04/11/2018   Cerebellar atrophy ([place]) 04/11/2018   Left anterior knee pain 04/11/2018   HLD (hyperlipidemia) 11/01/2016   Family history of colon cancer 11/01/2016   Encounter for well adult exam with abnormal findings 11/01/2016   Abnormal liver function tests 11/01/2016   Low libido 11/01/2016   Bee sting allergy    Allergic rhinitis    Blood in stool 11/19/2008   OTITIS MEDIA, ACUTE, BILATERAL 09/11/2008   SINUSITIS, CHRONIC 09/11/2008   VERTIGO 08/30/2007   INSOMNIA-SLEEP DISORDER-UNSPEC 08/30/2007   TOBACCO ABUSE 06/29/2007   ANEMIA-IRON DEFICIENCY 12/27/2006   Situational depression 12/27/2006   SINUSITIS- ACUTE-NOS 12/27/2006   Asthma 12/27/2006   MEIBOMIAN CYST 11/07/2006   Sebaceous cyst 11/07/2006    History reviewed. No pertinent surgical history.   OB History   No obstetric history on file.     Family History  Problem Relation Age of Onset   Lung cancer Mother    Colon  cancer Father     Social History   Tobacco Use   Smoking status: Current Every Day Smoker    Packs/day: 1.00    Types: Cigarettes   Smokeless tobacco: Never Used  Vaping Use   Vaping Use: Never used  Substance Use Topics   Alcohol use: Yes    Comment: 4/week   Drug use: No    Home Medications Prior to Admission medications   Medication Sig Start Date End Date Taking? Authorizing Provider  acetaminophen (TYLENOL) 500 MG tablet Take 500 mg by mouth every 6 (six) hours as needed for mild pain.     [provider]  chlordiazePOXIDE (LIBRIUM) 25 MG capsule 25 mg by mouth twice per day 04/11/18   Biagio Borg, MD  gabapentin (NEURONTIN) 100 MG capsule Take 1 capsule (100 mg total) by mouth 3 (three) times daily for 7 days. 11/03/19 11/10/19  Mliss Wedin A, PA-C  metoCLOPramide (REGLAN) 5 MG tablet Take 1 tablet (5 mg total) by mouth 4 (four) times daily. 03/15/17   Doran Stabler, MD  ondansetron (ZOFRAN ODT) 4 MG disintegrating tablet Take 1 tablet (4 mg total) by mouth every 8 (eight) hours as needed. 06/27/18   Marcial Pacas, MD  SUMAtriptan (IMITREX) 100 MG tablet Take 1 tablet (100 mg total) by mouth every 2 (two) hours as needed for migraine or headache. May repeat in 2 hours if headache persists or recurs. 06/27/18   Marcial Pacas, MD  thiamine 100 MG tablet Take 1 tablet (100 mg total) by mouth daily. 11/03/19 12/03/19  Amonda Brillhart A, PA-C  topiramate (TOPAMAX) 100 MG tablet Take 1 tablet (100 mg total) by mouth 2 (two) times daily. 06/27/18   Marcial Pacas, MD  traMADol (ULTRAM) 50 MG tablet Take 1 tablet (50 mg total) by mouth every 6 (six) hours as needed. 04/11/18   Biagio Borg, MD  vitamin B-12 (CYANOCOBALAMIN) 1000 MCG tablet Take 1 tablet (1,000 mcg total) by mouth daily for 7 days. 11/03/19 11/10/19  Gabbi Whetstone A, PA-C  Vitamin D, Ergocalciferol, (DRISDOL) 1.25 MG (50000 UT) CAPS capsule Take 1 capsule (50,000 Units total) by mouth every 7 (seven) days. 07/19/18   Biagio Borg, MD    Allergies    Bee venom  Review of Systems   Review of Systems  Constitutional: Negative for activity change, chills and fever.  Respiratory: Negative for shortness of breath and wheezing.   Cardiovascular: Positive for leg swelling. Negative for chest pain and palpitations.  Gastrointestinal: Negative for abdominal pain, diarrhea, nausea and vomiting.  Genitourinary: Negative for dysuria.  Musculoskeletal: Positive for arthralgias and myalgias. Negative for back pain, gait problem, joint swelling,  neck pain and neck stiffness.  Skin: Negative for rash.  Allergic/Immunologic: Negative for immunocompromised state.  Neurological: Negative for dizziness, seizures, syncope, weakness, numbness and headaches.  Psychiatric/Behavioral: Negative for confusion.    Physical Exam Updated Vital Signs BP (!) 157/105    Pulse (!) 108    Temp 98.3 F (36.8 C) (Oral)    Resp 16    Ht 5\' 1"  (1.549 m)    Wt 50.3 kg    SpO2 99%    BMI 20.97 kg/m   Physical Exam Vitals and nursing note reviewed.  Constitutional:      General: She is not in acute distress. HENT:     Head: Normocephalic.  Eyes:     Conjunctiva/sclera: Conjunctivae normal.  Cardiovascular:     Rate and Rhythm: Normal rate  and regular rhythm.     Heart sounds: No murmur heard.  No friction rub. No gallop.   Pulmonary:     Effort: Pulmonary effort is normal. No respiratory distress.     Breath sounds: No stridor. No wheezing, rhonchi or rales.  Chest:     Chest wall: No tenderness.  Abdominal:     General: There is no distension.     Palpations: Abdomen is soft. There is no mass.     Tenderness: There is no abdominal tenderness. There is no right CVA tenderness, left CVA tenderness, guarding or rebound.     Hernia: No hernia is present.  Musculoskeletal:     Cervical back: Neck supple.     Comments: 1+ pitting edema noted around the bilateral ankles.  No pretibial edema.  Full active and passive range of motion of the bilateral ankles, knees, and hips.  Sensation is intact and equal to light touch.  Good capillary refill.  DP and PT pulses are 2+ and symmetric.  She has mild tenderness palpation diffusely along bilateral lower legs.  No erythema, edema, induration, warmth, or purulent drainage.  Patellar reflexes are 2+ and symmetric.  Skin:    General: Skin is warm.     Findings: No rash.  Neurological:     Mental Status: She is alert.  Psychiatric:        Behavior: Behavior normal.     ED Results / Procedures /  Treatments   Labs (all labs ordered are listed, but only abnormal results are displayed) Labs Reviewed  COMPREHENSIVE METABOLIC PANEL - Abnormal; Notable for the following components:      Result Value   Calcium 8.5 (*)    All other components within normal limits  CBC - Abnormal; Notable for the following components:   WBC 12.1 (*)    RBC 3.35 (*)    Hemoglobin 11.7 (*)    HCT 34.8 (*)    MCV 103.9 (*)    MCH 34.9 (*)    Platelets 487 (*)    All other components within normal limits  URINALYSIS, ROUTINE W REFLEX MICROSCOPIC - Abnormal; Notable for the following components:   Color, Urine STRAW (*)    Leukocytes,Ua TRACE (*)    All other components within normal limits  BRAIN NATRIURETIC PEPTIDE - Abnormal; Notable for the following components:   B Natriuretic Peptide 288.7 (*)    All other components within normal limits  D-DIMER, QUANTITATIVE (NOT AT [facility])    EKG None  Radiology DG Chest 2 View  Result Date: 11/03/2019 CLINICAL DATA:  Foot swelling EXAM: CHEST - 2 VIEW COMPARISON:  None. FINDINGS: The heart size and mediastinal contours are within normal limits. Both lungs are clear. The visualized skeletal structures are unremarkable. IMPRESSION: No active cardiopulmonary disease. Electronically Signed   By: Ulyses Jarred M.D.   On: 11/03/2019 06:30    Procedures Procedures (including critical care time)  Medications Ordered in ED Medications  ketorolac (TORADOL) injection 30 mg (30 mg Intramuscular Given 11/03/19 6967)    ED Course  I have reviewed the triage vital signs and the nursing notes.  Pertinent labs & imaging results that were available during my care of the patient were reviewed by me and considered in my medical decision making (see chart for details).    MDM Rules/Calculators/A&P                          49 year old female with  a history of alcohol use disorder, asthma, tobacco use disorder presenting with bilateral lower extremity pain and  swelling for the last few weeks.  Mildly hypertensive.  She is afebrile.  Vital signs are otherwise reassuring.  The patient was discussed with Dr. Florina Ou, attending physician.  The patient's labs and imaging have been independently evaluated and interpreted by me.  Patient does have a mild leukocytosis, but no evidence of infection.  Clinically, she does not have cellulitis.  Of low suspicion for osteomyelitis given her exam.  D-dimer was ordered by triage nurse, which is not elevated.  Given the bilateral nature of her symptoms, I have a very low suspicion for DVT.  She does have some peripheral edema around the bilateral ankles.  No abdominal distention.  Lungs are clear to auscultation bilaterally.  Chest x-ray does not demonstrate vascular congestion.  BNP minimally elevated around 200.  Albumin is normal.  Labs are otherwise reassuring.  I doubt the patient has acute congestive heart failure.  Given the pain, I am concerned for peripheral poly-neuropathy, especially given her history of heavy alcohol use per chart review.  We will trial the patient on gabapentin.  She is also been encouraged to initiate folate and vitamin B12 supplementation since she did have a macrocytic anemia today.  All questions answered.  She is hemodynamically stable and in no acute distress.  Safe for discharge to home with outpatient follow-up recommended within the next week.    Final Clinical Impression(s) / ED Diagnoses Final diagnoses:  Peripheral edema  Polyneuropathy    Rx / DC Orders ED Discharge Orders         Ordered    gabapentin (NEURONTIN) 100 MG capsule  3 times daily        11/03/19 0741    thiamine 100 MG tablet  Daily        11/03/19 0741    vitamin B-12 (CYANOCOBALAMIN) 1000 MCG tablet  Daily        11/03/19 0741           Joanne Gavel, PA-C 11/03/19 0933    Molpus, John, MD 11/03/19 2251

## 2019-11-03 NOTE — Discharge Instructions (Signed)
Thank you for allowing me to care for you today in the Emergency Department.   Take 650 mg of Tylenol or 600 mg of ibuprofen with food every 6 hours for pain.  You can alternate between these 2 medications every 3 hours if your pain returns.  For instance, you can take Tylenol at noon, followed by a dose of ibuprofen at 3, followed by second dose of Tylenol and 6.  Try to elevate your leg so that your toes are at or above the level of your nose to help with pain and swelling.  Wearing compression stockings as you can tolerate them, especially when you are up and walking to help with swelling.  You can take 1 tablet of gabapentin every 8 hours to help with pain.  I have given you a 1 week supply.  Please follow-up with primary care after your ER visit today to review how this medication is working for you.  It may need to be adjusted to a different dose.  Take your folate and vitamin B12 as prescribed.  Please follow-up with your primary care clinician to discuss alcohol cessation.  I have also provided resources for you along with your discharge paperwork.  Please note, you should never stop suddenly drinking alcohol if you have been drinking regularly for a long period of time.  This can put you at risk for having a seizure.  You should only stop drinking alcohol at a facility where you can be observed.  Return the emergency department if you have high fevers, if your leg gets red and hot to the touch, if you develop significant shortness of breath, chest pain, high fevers, or other new, concerning symptoms.

## 2019-11-03 NOTE — ED Triage Notes (Signed)
Patient has swollen feet. The left is bigger than the right. Patient states this has been going on for weeks. Patient states it is painful to the touch.

## 2020-04-13 IMAGING — MR MRI HEAD WITHOUT AND WITH CONTRAST
14 of 17 series · 34 of 48 positions shown · IV contrast (gadavist)
Comparison: CT head without contrast of the same day.

CLINICAL DATA: Abnormal CT.  Seizure, new, nontraumatic.

EXAM:
MRI HEAD WITHOUT AND WITH CONTRAST
TECHNIQUE: Multiplanar, multiecho pulse sequences of the brain and surrounding
structures were obtained without and with intravenous contrast.
CONTRAST:  5 mL Gadavist

[Series 5: DWI · axial · 3.0mm · 0.88mm/px · z∈[-30,+104]mm · 5 of 92 slices shown (1 of 2)]
[im 1/92]
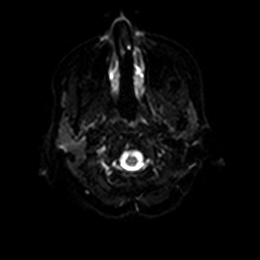
[im 23/92]
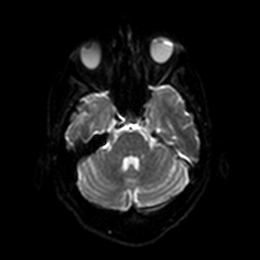
[im 46/92]
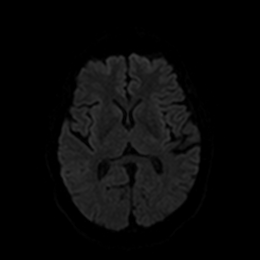
[im 69/92]
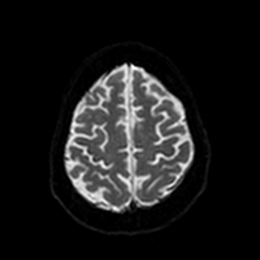
[im 92/92]
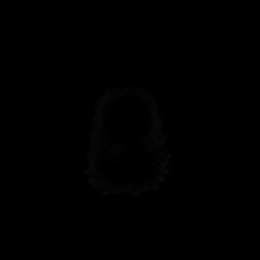

[Series 6: DWI · axial · 3.0mm · 0.88mm/px · z∈[-30,+104]mm · 3 of 46 slices shown (2 of 2)]
[im 1/46]
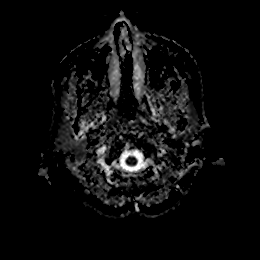
[im 23/46]
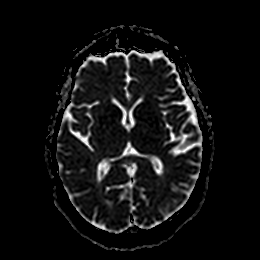
[im 46/46]
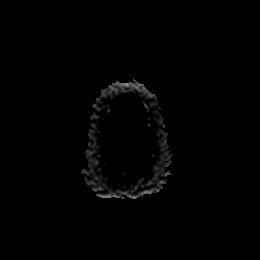

[Series 7: cor dwi_tracew · coronal · 4.0mm · 0.88mm/px · 4 of 68 slices shown]
[im 1/68]
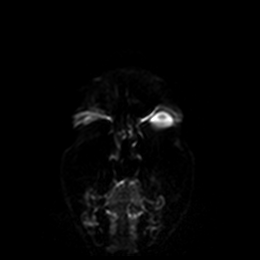
[im 23/68]
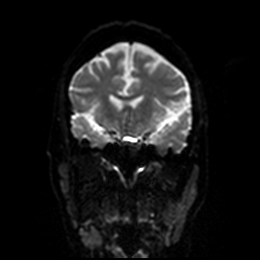
[im 45/68]
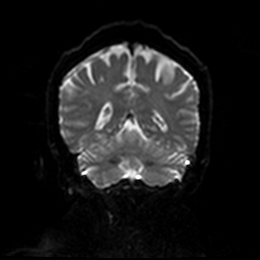
[im 68/68]
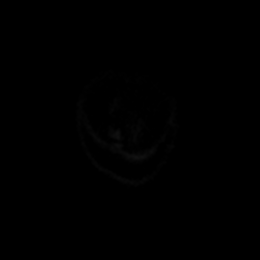

[Series 8: cor dwi_adc · coronal · 4.0mm · 0.88mm/px · 2 of 34 slices shown]
[im 1/34]
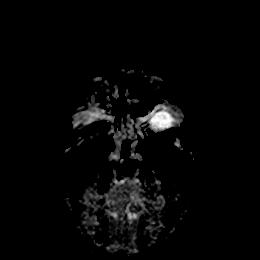
[im 34/34]
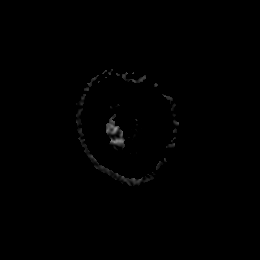

[Series 9: T1 · sagittal · 5.0mm · 0.75mm/px · 2 of 25 slices shown]
[im 1/25]
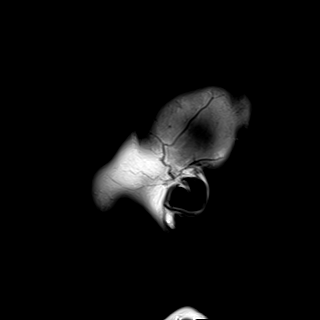
[im 25/25]
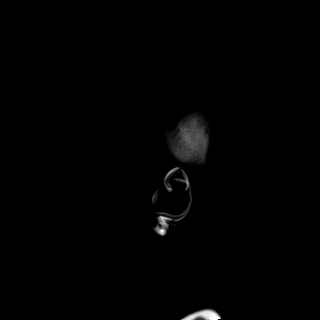

[Series 10: T2 · axial · 5.0mm · 0.72mm/px · z∈[-42,+102]mm · 2 of 25 slices shown (1 of 3)]
[im 1/25]
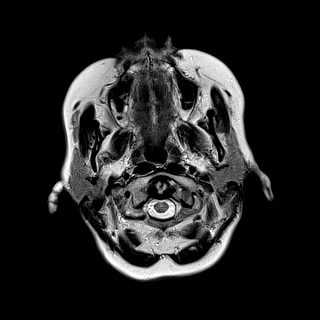
[im 25/25]
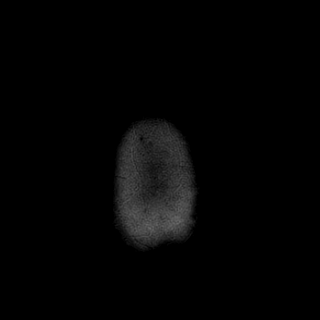

[Series 11: FLAIR · axial · 5.0mm · 0.45mm/px · z∈[-41,+102]mm · 2 of 25 slices shown (1 of 2)]
[im 1/25]
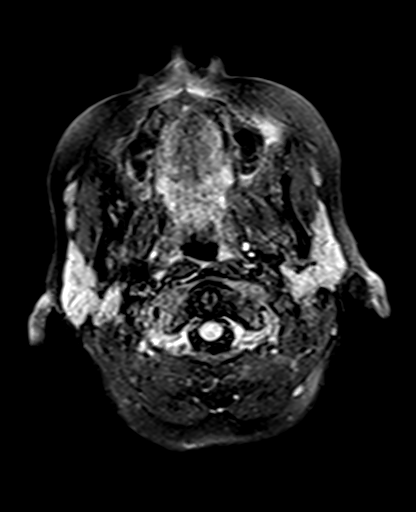
[im 25/25]
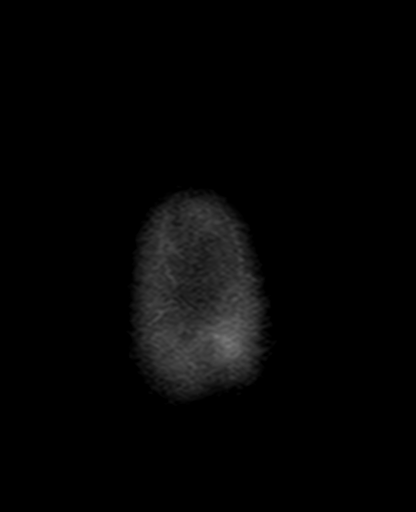

[Series 13: pha_images · axial · 3.0mm · 0.90mm/px · z∈[-55,-1]mm · 2 of 57 slices shown]
[im 1/57]
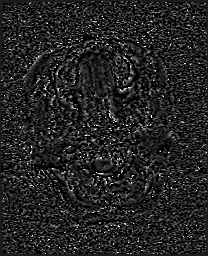
[im 19/57]
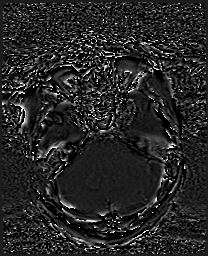

[Series 21: T2 · coronal · 3.0mm · 0.27mm/px · 2 of 32 slices shown (2 of 3)]
[im 1/32]
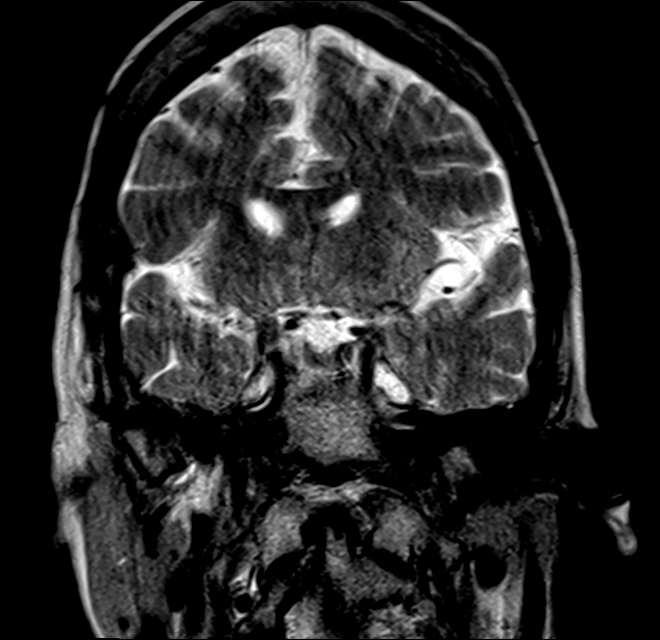
[im 32/32]
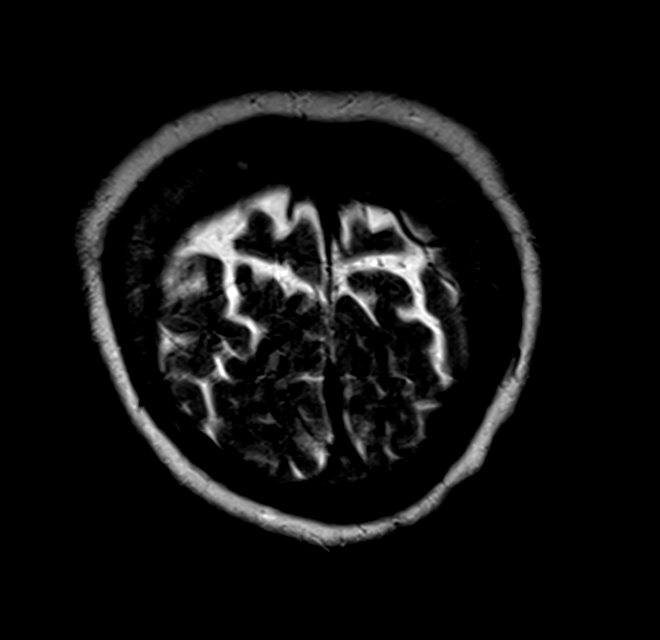

[Series 22: FLAIR · coronal · 3.0mm · 0.56mm/px · 2 of 32 slices shown (2 of 2)]
[im 1/32]
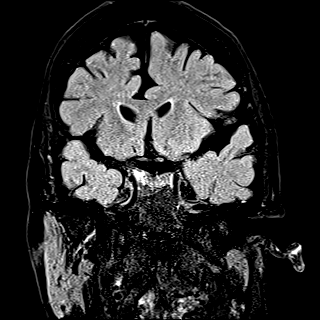
[im 32/32]
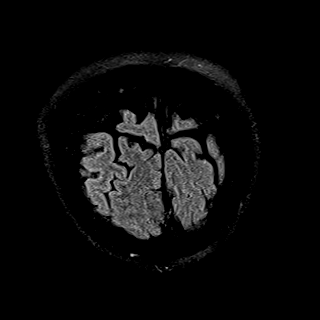

[Series 23: T2 · coronal · 3.0mm · 0.27mm/px · 2 of 32 slices shown (3 of 3)]
[im 1/32]
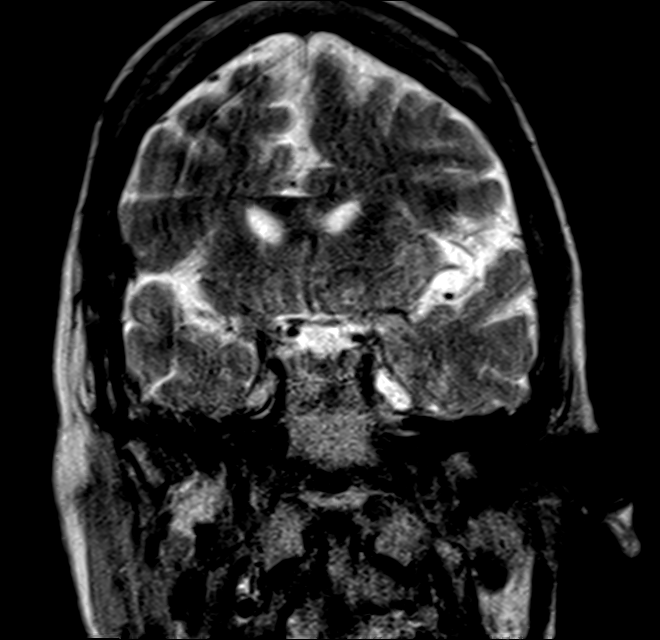
[im 32/32]
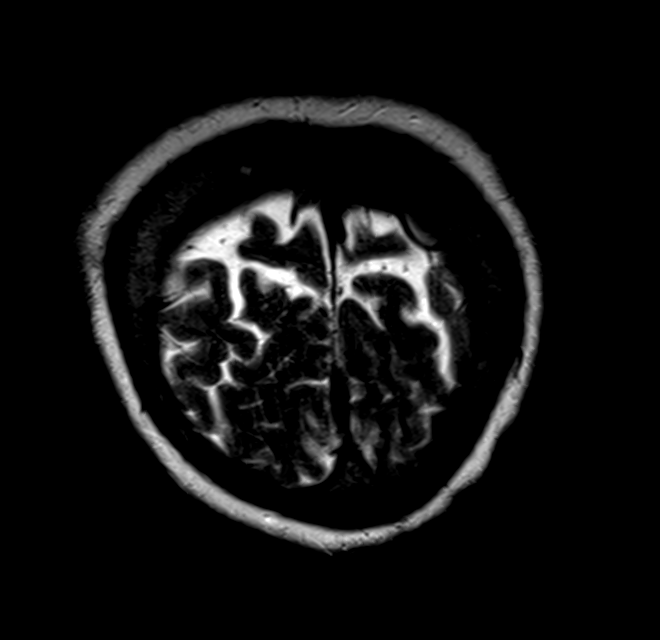

[Series 24: T2 post-contrast · coronal · 5.0mm · 0.72mm/px · 2 of 29 slices shown]
[im 1/29]
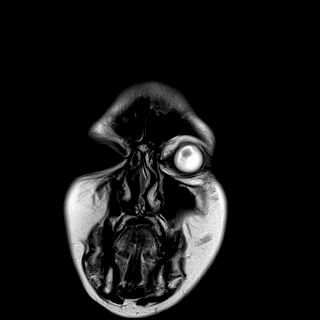
[im 29/29]
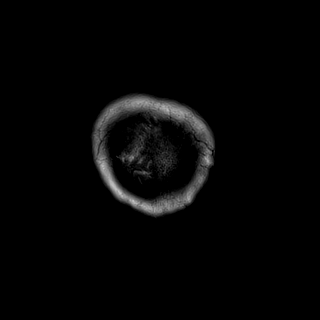

[Series 26: T1 post-contrast · coronal · 5.0mm · 0.34mm/px · 2 of 29 slices shown (1 of 2)]
[im 1/29]
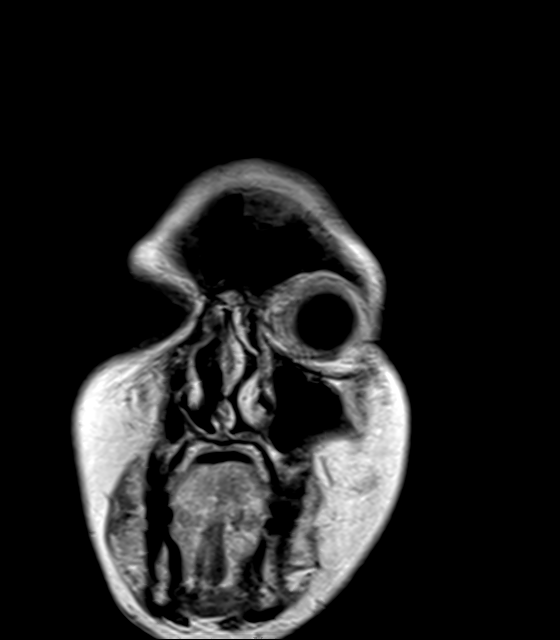
[im 29/29]
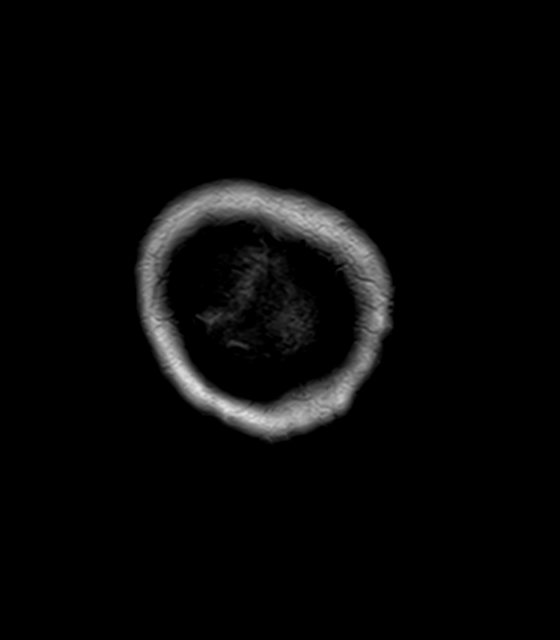

[Series 27: T1 post-contrast · sagittal · 5.0mm · 0.75mm/px · 2 of 25 slices shown (2 of 2)]
[im 1/25]
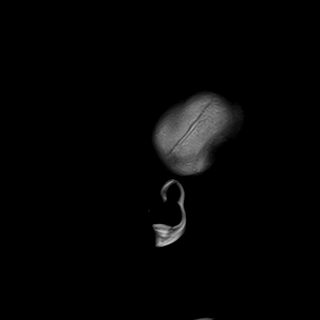
[im 25/25]
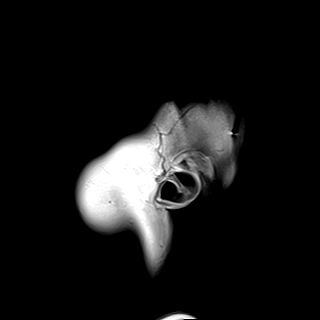

[34 of 48 positions shown; findings below may reference images not displayed]

FINDINGS: Brain: No acute infarct, hemorrhage, or mass lesion is present. The
hypodense area on CT appears to represent the terminal aspect of the
posterior horn of the right lateral ventricle. No significant white
matter disease is present. Basal ganglia are intact.

The internal auditory canals are within normal limits. Brainstem is
within normal limits. Mild cerebellar atrophy may be related to
alcohol abuse.

Dedicated imaging the temporal lobes demonstrate symmetric size and
signal of the hippocampal structures.

Postcontrast images demonstrate no pathologic enhancement.

Vascular: Flow is present in the major intracranial arteries.

Skull and upper cervical spine: The craniocervical junction is
normal. Upper cervical spine is within normal limits. Marrow signal
is unremarkable.

Sinuses/Orbits: The paranasal sinuses and mastoid air cells are
clear. The globes and orbits are within normal limits.
IMPRESSION: 1. No acute or subacute intracranial abnormality.
2. No acute or focal lesion to explain seizures.
3. Mild cerebellar atrophy, likely reflecting sequela of chronic
alcohol abuse.

## 2020-04-15 IMAGING — DX LEFT KNEE - COMPLETE 4+ VIEW
4 series · 4 of 4 positions shown · non-contrast
Comparison: None.

CLINICAL DATA: Left knee pain and swelling after fall yesterday.

EXAM:
LEFT KNEE - COMPLETE 4+ VIEW

[knee ap]
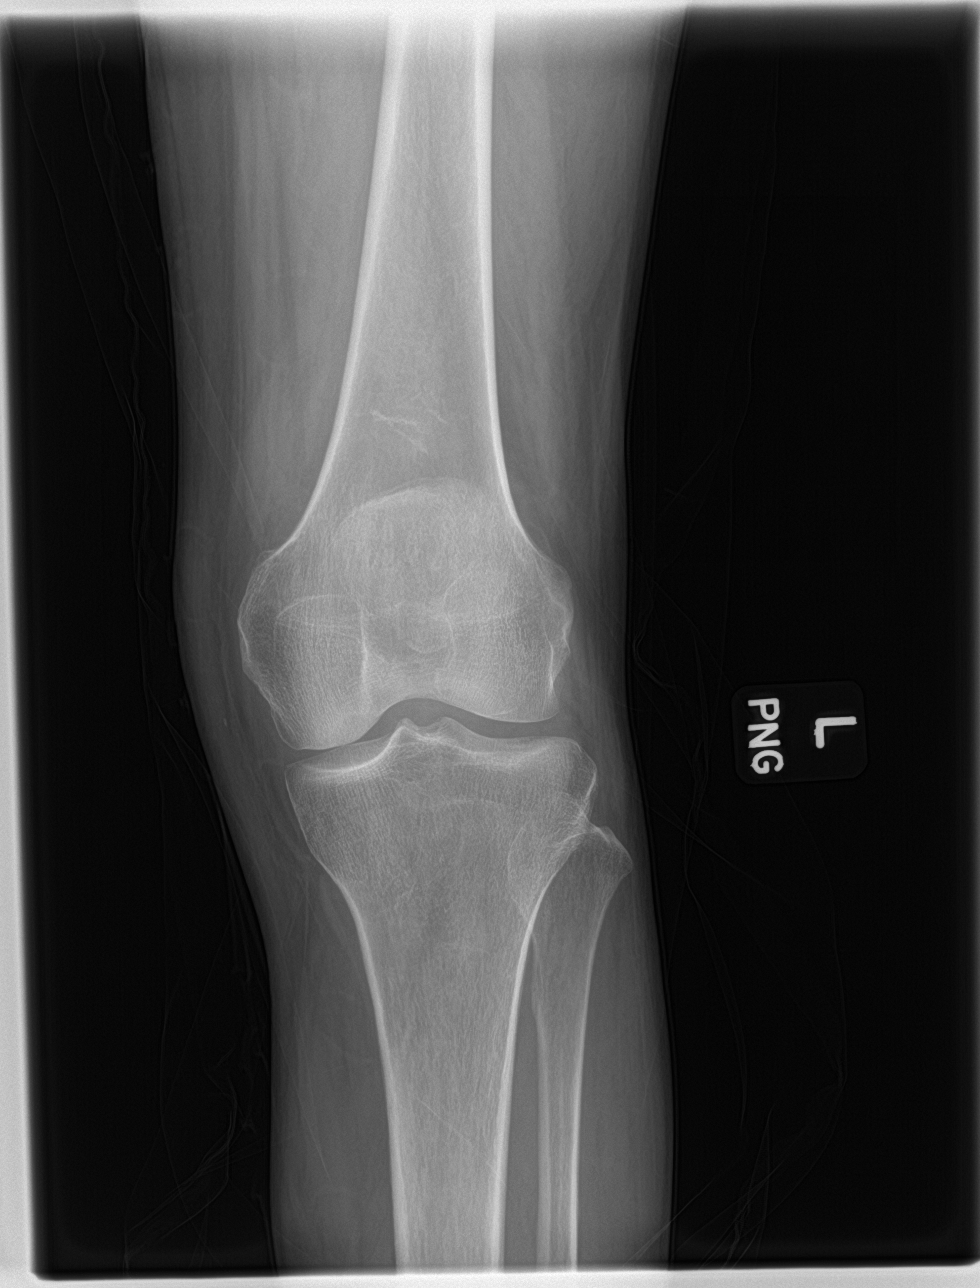

[knee lat]
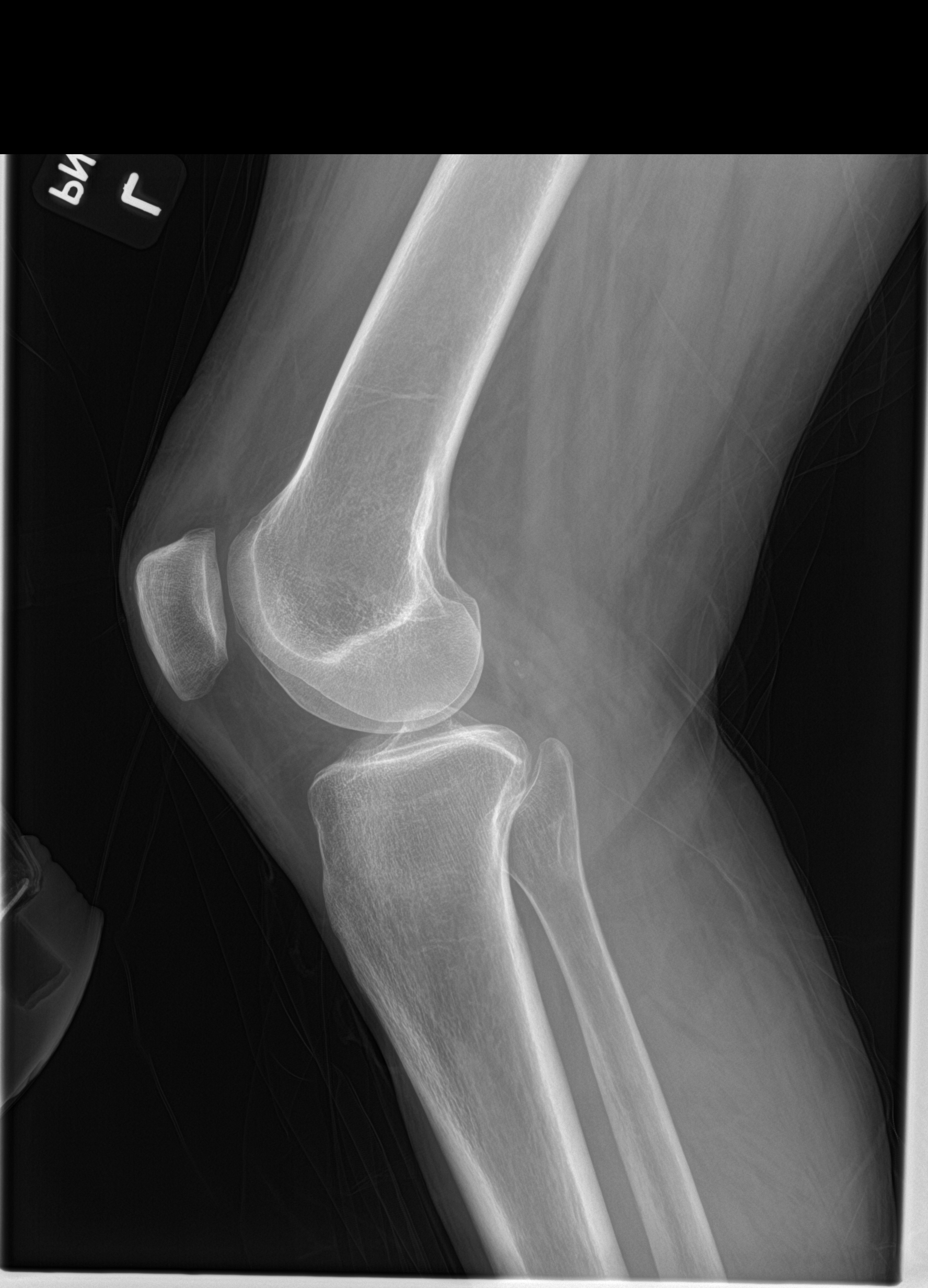

[knee obl (1 of 2)]
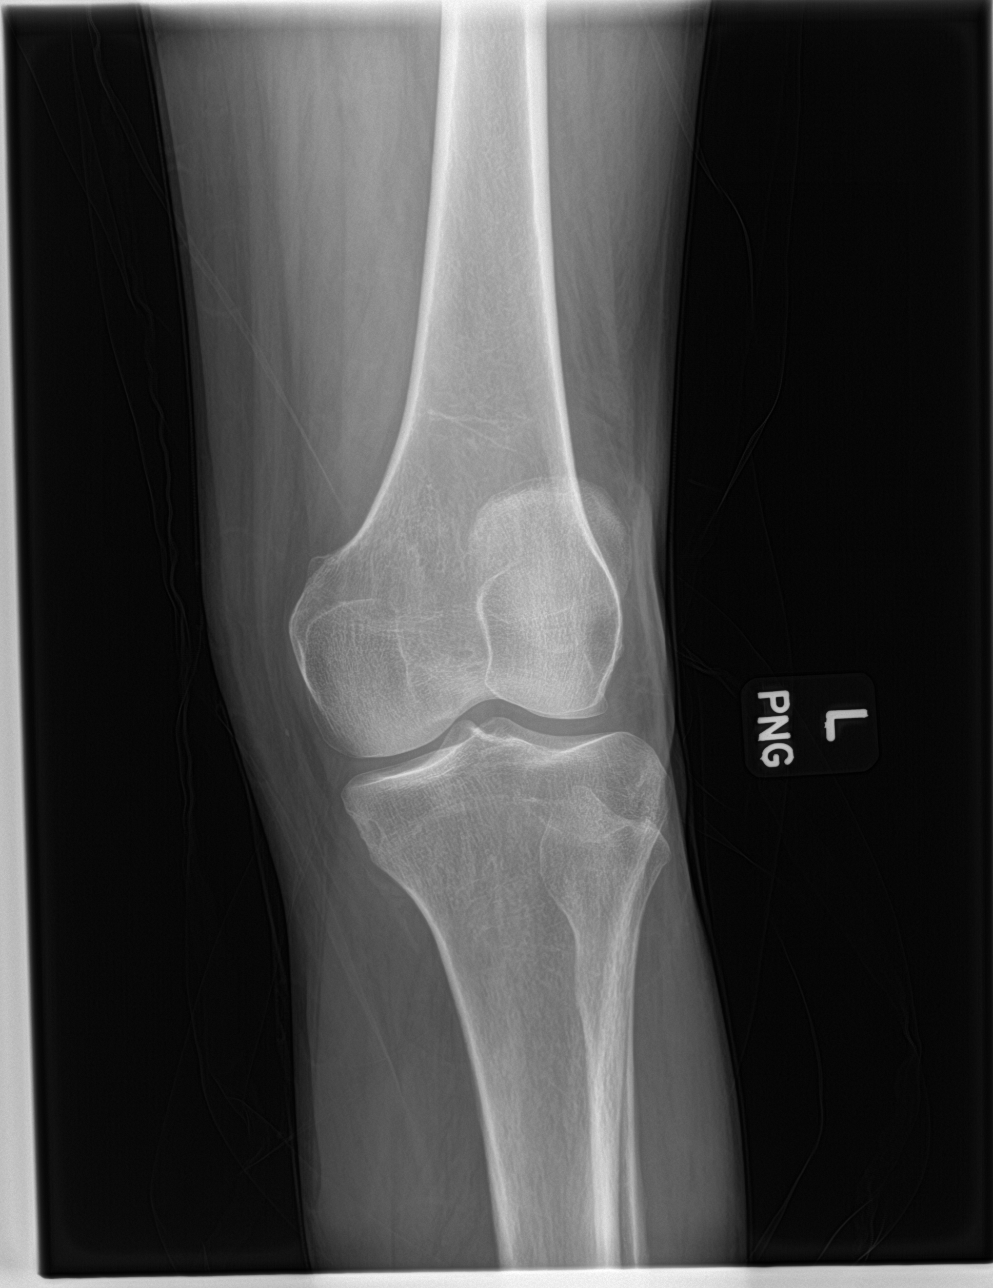

[knee obl (2 of 2)]
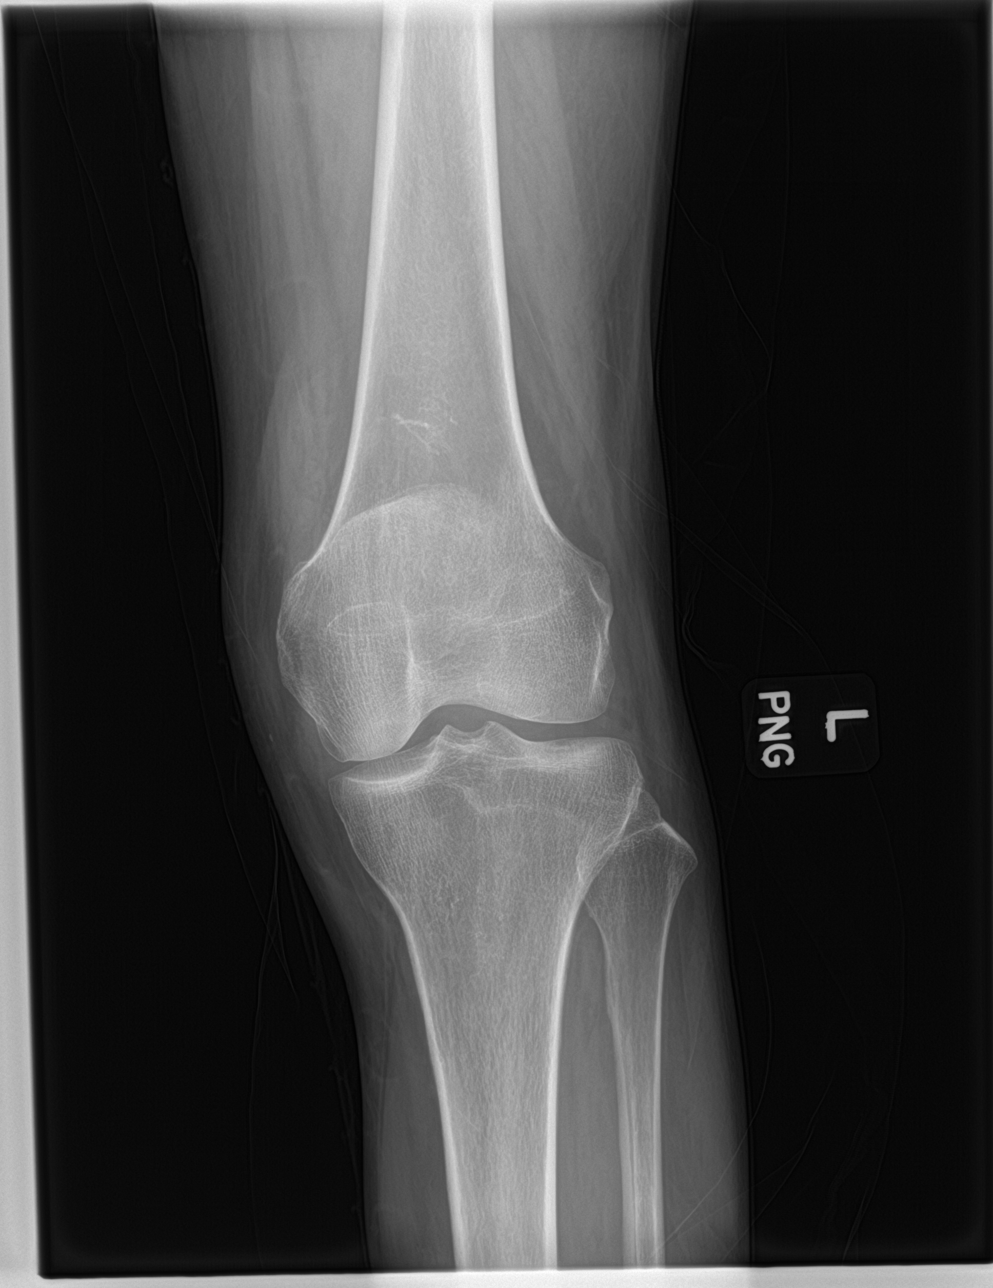

[4 of 4 positions shown; findings below may reference images not displayed]

FINDINGS: The femorotibial and patellofemoral compartments are maintained and
aligned without joint effusion or dislocation. No acute displaced
fracture is identified. Slight soft tissue swelling along the
anteromedial aspect of the knee is suggested.
IMPRESSION: 1. Mild soft tissue swelling about the knee anteromedially.
2. No acute osseous appearing abnormality.
3. No joint effusion.

## 2022-02-01 ENCOUNTER — Encounter: Payer: Self-pay | Admitting: Gastroenterology

## 2022-03-27 ENCOUNTER — Inpatient Hospital Stay (HOSPITAL_COMMUNITY)
Admission: EM | Admit: 2022-03-27 | Discharge: 2022-03-30 | DRG: 199 | Disposition: A | Discharge status: Home / Self Care | Payer: Federal, State, Local not specified - PPO | Attending: Surgery | Admitting: Surgery

## 2022-03-27 ENCOUNTER — Emergency Department (HOSPITAL_COMMUNITY): Payer: Federal, State, Local not specified - PPO

## 2022-03-27 ENCOUNTER — Encounter (HOSPITAL_COMMUNITY): Payer: Self-pay | Admitting: Surgery

## 2022-03-27 ENCOUNTER — Inpatient Hospital Stay (HOSPITAL_COMMUNITY): Payer: Federal, State, Local not specified - PPO

## 2022-03-27 DIAGNOSIS — W3400XA Accidental discharge from unspecified firearms or gun, initial encounter: Secondary | ICD-10-CM | POA: Diagnosis not present

## 2022-03-27 DIAGNOSIS — F10129 Alcohol abuse with intoxication, unspecified: Secondary | ICD-10-CM | POA: Diagnosis present

## 2022-03-27 DIAGNOSIS — T797XXA Traumatic subcutaneous emphysema, initial encounter: Secondary | ICD-10-CM | POA: Diagnosis present

## 2022-03-27 DIAGNOSIS — Z880 Allergy status to penicillin: Secondary | ICD-10-CM | POA: Diagnosis not present

## 2022-03-27 DIAGNOSIS — S271XXA Traumatic hemothorax, initial encounter: Secondary | ICD-10-CM | POA: Diagnosis present

## 2022-03-27 DIAGNOSIS — S2242XB Multiple fractures of ribs, left side, initial encounter for open fracture: Secondary | ICD-10-CM | POA: Diagnosis present

## 2022-03-27 DIAGNOSIS — S2249XA Multiple fractures of ribs, unspecified side, initial encounter for closed fracture: Secondary | ICD-10-CM | POA: Diagnosis present

## 2022-03-27 DIAGNOSIS — R339 Retention of urine, unspecified: Secondary | ICD-10-CM | POA: Diagnosis not present

## 2022-03-27 DIAGNOSIS — S21432A Puncture wound without foreign body of left back wall of thorax with penetration into thoracic cavity, initial encounter: Secondary | ICD-10-CM | POA: Diagnosis present

## 2022-03-27 DIAGNOSIS — F10139 Alcohol abuse with withdrawal, unspecified: Secondary | ICD-10-CM | POA: Diagnosis not present

## 2022-03-27 DIAGNOSIS — F17210 Nicotine dependence, cigarettes, uncomplicated: Secondary | ICD-10-CM | POA: Diagnosis present

## 2022-03-27 DIAGNOSIS — E876 Hypokalemia: Secondary | ICD-10-CM | POA: Diagnosis present

## 2022-03-27 DIAGNOSIS — E871 Hypo-osmolality and hyponatremia: Secondary | ICD-10-CM | POA: Diagnosis not present

## 2022-03-27 DIAGNOSIS — Y249XXA Unspecified firearm discharge, undetermined intent, initial encounter: Secondary | ICD-10-CM

## 2022-03-27 DIAGNOSIS — Y909 Presence of alcohol in blood, level not specified: Secondary | ICD-10-CM | POA: Diagnosis present

## 2022-03-27 DIAGNOSIS — S2242XA Multiple fractures of ribs, left side, initial encounter for closed fracture: Secondary | ICD-10-CM | POA: Diagnosis present

## 2022-03-27 DIAGNOSIS — S27331A Laceration of lung, unilateral, initial encounter: Secondary | ICD-10-CM | POA: Diagnosis present

## 2022-03-27 DIAGNOSIS — D62 Acute posthemorrhagic anemia: Secondary | ICD-10-CM | POA: Diagnosis present

## 2022-03-27 DIAGNOSIS — R0489 Hemorrhage from other sites in respiratory passages: Secondary | ICD-10-CM | POA: Diagnosis present

## 2022-03-27 LAB — CBC
HCT: 29.5 % — ABNORMAL LOW (ref 36.0–46.0)
Hemoglobin: 9 g/dL — ABNORMAL LOW (ref 12.0–15.0)
MCH: 26.5 pg (ref 26.0–34.0)
MCHC: 30.5 g/dL (ref 30.0–36.0)
MCV: 86.8 fL (ref 80.0–100.0)
Platelets: 412 10*3/uL — ABNORMAL HIGH (ref 150–400)
RBC: 3.4 MIL/uL — ABNORMAL LOW (ref 3.87–5.11)
RDW: 20.5 % — ABNORMAL HIGH (ref 11.5–15.5)
WBC: 9.8 10*3/uL (ref 4.0–10.5)
nRBC: 0 % (ref 0.0–0.2)

## 2022-03-27 LAB — I-STAT CHEM 8, ED
BUN: 8 mg/dL (ref 6–20)
Calcium, Ion: 1.05 mmol/L — ABNORMAL LOW (ref 1.15–1.40)
Chloride: 109 mmol/L (ref 98–111)
Creatinine, Ser: 1 mg/dL (ref 0.44–1.00)
Glucose, Bld: 113 mg/dL — ABNORMAL HIGH (ref 70–99)
HCT: 30 % — ABNORMAL LOW (ref 36.0–46.0)
Hemoglobin: 10.2 g/dL — ABNORMAL LOW (ref 12.0–15.0)
Potassium: 3.4 mmol/L — ABNORMAL LOW (ref 3.5–5.1)
Sodium: 142 mmol/L (ref 135–145)
TCO2: 17 mmol/L — ABNORMAL LOW (ref 22–32)

## 2022-03-27 LAB — COMPREHENSIVE METABOLIC PANEL
ALT: 15 U/L (ref 0–44)
AST: 21 U/L (ref 15–41)
Albumin: 3.2 g/dL — ABNORMAL LOW (ref 3.5–5.0)
Alkaline Phosphatase: 67 U/L (ref 38–126)
Anion gap: 11 (ref 5–15)
BUN: 9 mg/dL (ref 6–20)
CO2: 19 mmol/L — ABNORMAL LOW (ref 22–32)
Calcium: 8.2 mg/dL — ABNORMAL LOW (ref 8.9–10.3)
Chloride: 108 mmol/L (ref 98–111)
Creatinine, Ser: 0.81 mg/dL (ref 0.44–1.00)
GFR, Estimated: >60 mL/min (ref >60)
Glucose, Bld: 119 mg/dL — ABNORMAL HIGH (ref 70–99)
Potassium: 3.4 mmol/L — ABNORMAL LOW (ref 3.5–5.1)
Sodium: 138 mmol/L (ref 135–145)
Total Bilirubin: 0.2 mg/dL — ABNORMAL LOW (ref 0.3–1.2)
Total Protein: 6.2 g/dL — ABNORMAL LOW (ref 6.5–8.1)

## 2022-03-27 LAB — PROTIME-INR
INR: 1.1 (ref 0.8–1.2)
Prothrombin Time: 14 seconds (ref 11.4–15.2)

## 2022-03-27 LAB — ETHANOL: Alcohol, Ethyl (B): 157 mg/dL — ABNORMAL HIGH (ref <10)

## 2022-03-27 LAB — HIV ANTIBODY (ROUTINE TESTING W REFLEX): HIV Screen 4th Generation wRfx: NONREACTIVE

## 2022-03-27 LAB — LACTIC ACID, PLASMA: Lactic Acid, Venous: 3.8 mmol/L (ref 0.5–1.9)

## 2022-03-27 MED ORDER — LIDOCAINE HCL (PF) 1 % IJ SOLN
INTRAMUSCULAR | Status: AC
  Filled 2022-03-27: qty 5

## 2022-03-27 MED ORDER — DOCUSATE SODIUM 100 MG PO CAPS
100.0000 mg | ORAL_CAPSULE | Freq: Two times a day (BID) | ORAL | Status: DC
  Administered 2022-03-27 – 2022-03-30 (×7): 100 mg via ORAL
  Filled 2022-03-27 (×7): qty 1

## 2022-03-27 MED ORDER — FENTANYL CITRATE (PF) 100 MCG/2ML IJ SOLN
INTRAMUSCULAR | Status: AC
  Administered 2022-03-27: 100 ug
  Filled 2022-03-27: qty 2

## 2022-03-27 MED ORDER — METHOCARBAMOL 500 MG PO TABS
500.0000 mg | ORAL_TABLET | Freq: Three times a day (TID) | ORAL | Status: DC | PRN
  Administered 2022-03-27 – 2022-03-28 (×2): 500 mg via ORAL
  Filled 2022-03-27 (×2): qty 1

## 2022-03-27 MED ORDER — ONDANSETRON 4 MG PO TBDP: 4.0000 mg | ORAL_TABLET | Freq: Four times a day (QID) | ORAL | Status: DC | PRN

## 2022-03-27 MED ORDER — OXYCODONE HCL 5 MG PO TABS
5.0000 mg | ORAL_TABLET | ORAL | Status: DC | PRN
  Administered 2022-03-27: 5 mg via ORAL
  Administered 2022-03-27 – 2022-03-28 (×2): 10 mg via ORAL
  Filled 2022-03-27: qty 2
  Filled 2022-03-27: qty 1
  Filled 2022-03-27 (×3): qty 2

## 2022-03-27 MED ORDER — LORAZEPAM 1 MG PO TABS
1.0000 mg | ORAL_TABLET | ORAL | Status: AC | PRN
  Administered 2022-03-28 – 2022-03-29 (×2): 1 mg via ORAL
  Filled 2022-03-27 (×3): qty 1

## 2022-03-27 MED ORDER — THIAMINE MONONITRATE 100 MG PO TABS
100.0000 mg | ORAL_TABLET | Freq: Every day | ORAL | Status: DC
  Administered 2022-03-27 – 2022-03-30 (×4): 100 mg via ORAL
  Filled 2022-03-27 (×4): qty 1

## 2022-03-27 MED ORDER — FOLIC ACID 1 MG PO TABS
1.0000 mg | ORAL_TABLET | Freq: Every day | ORAL | Status: DC
  Administered 2022-03-27 – 2022-03-30 (×4): 1 mg via ORAL
  Filled 2022-03-27 (×4): qty 1

## 2022-03-27 MED ORDER — HYDROMORPHONE HCL 1 MG/ML IJ SOLN: 0.5000 mg | INTRAMUSCULAR | Status: DC | PRN

## 2022-03-27 MED ORDER — ENOXAPARIN SODIUM 30 MG/0.3ML IJ SOSY
30.0000 mg | PREFILLED_SYRINGE | Freq: Two times a day (BID) | INTRAMUSCULAR | Status: DC
  Administered 2022-03-28 – 2022-03-30 (×4): 30 mg via SUBCUTANEOUS
  Filled 2022-03-27 (×5): qty 0.3

## 2022-03-27 MED ORDER — IOHEXOL 350 MG/ML SOLN
50.0000 mL | Freq: Once | INTRAVENOUS | Status: AC | PRN
  Administered 2022-03-27: 50 mL via INTRAVENOUS

## 2022-03-27 MED ORDER — LIDOCAINE HCL 2 % IJ SOLN
INTRAMUSCULAR | Status: AC
  Administered 2022-03-27: 400 mg
  Filled 2022-03-27: qty 20

## 2022-03-27 MED ORDER — DIPHENHYDRAMINE HCL 25 MG PO CAPS
25.0000 mg | ORAL_CAPSULE | Freq: Three times a day (TID) | ORAL | Status: DC | PRN
  Administered 2022-03-27 – 2022-03-29 (×3): 25 mg via ORAL
  Filled 2022-03-27 (×3): qty 1

## 2022-03-27 MED ORDER — ADULT MULTIVITAMIN W/MINERALS CH
1.0000 | ORAL_TABLET | Freq: Every day | ORAL | Status: DC
  Administered 2022-03-27 – 2022-03-30 (×4): 1 via ORAL
  Filled 2022-03-27 (×4): qty 1

## 2022-03-27 MED ORDER — ACETAMINOPHEN 500 MG PO TABS
1000.0000 mg | ORAL_TABLET | Freq: Three times a day (TID) | ORAL | Status: DC
  Administered 2022-03-27 – 2022-03-28 (×6): 1000 mg via ORAL
  Filled 2022-03-27 (×7): qty 2

## 2022-03-27 MED ORDER — ONDANSETRON HCL 4 MG/2ML IJ SOLN
4.0000 mg | Freq: Four times a day (QID) | INTRAMUSCULAR | Status: DC | PRN
  Administered 2022-03-27: 4 mg via INTRAVENOUS
  Filled 2022-03-27: qty 2

## 2022-03-27 MED ORDER — LORAZEPAM 1 MG PO TABS
1.0000 mg | ORAL_TABLET | ORAL | Status: AC | PRN
  Administered 2022-03-27: 1 mg via ORAL
  Administered 2022-03-28: 2 mg via ORAL
  Administered 2022-03-28: 1 mg via ORAL
  Administered 2022-03-29 (×3): 2 mg via ORAL
  Filled 2022-03-27 (×4): qty 2
  Filled 2022-03-27: qty 1

## 2022-03-27 MED ORDER — HYDROMORPHONE HCL 1 MG/ML IJ SOLN
0.5000 mg | INTRAMUSCULAR | Status: DC | PRN
  Administered 2022-03-27: 0.5 mg via INTRAVENOUS
  Filled 2022-03-27: qty 1

## 2022-03-27 MED ORDER — POTASSIUM CHLORIDE 10 MEQ/100ML IV SOLN
10.0000 meq | INTRAVENOUS | Status: AC
  Administered 2022-03-27 (×2): 10 meq via INTRAVENOUS
  Filled 2022-03-27 (×2): qty 100

## 2022-03-27 MED ORDER — HYDROMORPHONE HCL 1 MG/ML IJ SOLN
0.5000 mg | INTRAMUSCULAR | Status: DC | PRN
  Administered 2022-03-27 – 2022-03-28 (×3): 1 mg via INTRAVENOUS
  Filled 2022-03-27 (×3): qty 1

## 2022-03-27 MED ORDER — THIAMINE HCL 100 MG/ML IJ SOLN
100.0000 mg | Freq: Every day | INTRAMUSCULAR | Status: DC
  Filled 2022-03-27: qty 2

## 2022-03-27 MED ORDER — METHOCARBAMOL 1000 MG/10ML IJ SOLN: 500.0000 mg | Freq: Three times a day (TID) | INTRAVENOUS | Status: DC | PRN

## 2022-03-28 ENCOUNTER — Inpatient Hospital Stay (HOSPITAL_COMMUNITY): Payer: Federal, State, Local not specified - PPO

## 2022-03-28 LAB — BASIC METABOLIC PANEL
Anion gap: 10 (ref 5–15)
BUN: 15 mg/dL (ref 6–20)
CO2: 23 mmol/L (ref 22–32)
Calcium: 8.6 mg/dL — ABNORMAL LOW (ref 8.9–10.3)
Chloride: 99 mmol/L (ref 98–111)
Creatinine, Ser: 0.76 mg/dL (ref 0.44–1.00)
GFR, Estimated: >60 mL/min (ref >60)
Glucose, Bld: 71 mg/dL (ref 70–99)
Potassium: 4.1 mmol/L (ref 3.5–5.1)
Sodium: 132 mmol/L — ABNORMAL LOW (ref 135–145)

## 2022-03-28 LAB — CBC WITH DIFFERENTIAL/PLATELET
Abs Immature Granulocytes: 0.04 10*3/uL (ref 0.00–0.07)
Basophils Absolute: 0 10*3/uL (ref 0.0–0.1)
Basophils Relative: 0 %
Eosinophils Absolute: 0 10*3/uL (ref 0.0–0.5)
Eosinophils Relative: 0 %
HCT: 21.3 % — ABNORMAL LOW (ref 36.0–46.0)
Hemoglobin: 6.8 g/dL — CL (ref 12.0–15.0)
Immature Granulocytes: 0 %
Lymphocytes Relative: 14 %
Lymphs Abs: 1.4 10*3/uL (ref 0.7–4.0)
MCH: 26.7 pg (ref 26.0–34.0)
MCHC: 31.9 g/dL (ref 30.0–36.0)
MCV: 83.5 fL (ref 80.0–100.0)
Monocytes Absolute: 0.7 10*3/uL (ref 0.1–1.0)
Monocytes Relative: 7 %
Neutro Abs: 7.5 10*3/uL (ref 1.7–7.7)
Neutrophils Relative %: 79 %
Platelets: 314 10*3/uL (ref 150–400)
RBC: 2.55 MIL/uL — ABNORMAL LOW (ref 3.87–5.11)
RDW: 19.8 % — ABNORMAL HIGH (ref 11.5–15.5)
WBC: 9.6 10*3/uL (ref 4.0–10.5)
nRBC: 0 % (ref 0.0–0.2)

## 2022-03-28 LAB — CBC
HCT: 24.3 % — ABNORMAL LOW (ref 36.0–46.0)
Hemoglobin: 7.3 g/dL — ABNORMAL LOW (ref 12.0–15.0)
MCH: 25.6 pg — ABNORMAL LOW (ref 26.0–34.0)
MCHC: 30 g/dL (ref 30.0–36.0)
MCV: 85.3 fL (ref 80.0–100.0)
Platelets: 351 10*3/uL (ref 150–400)
RBC: 2.85 MIL/uL — ABNORMAL LOW (ref 3.87–5.11)
RDW: 19.7 % — ABNORMAL HIGH (ref 11.5–15.5)
WBC: 10.5 10*3/uL (ref 4.0–10.5)
nRBC: 0 % (ref 0.0–0.2)

## 2022-03-28 LAB — URINALYSIS, ROUTINE W REFLEX MICROSCOPIC
Bilirubin Urine: NEGATIVE
Glucose, UA: NEGATIVE mg/dL
Hgb urine dipstick: NEGATIVE
Ketones, ur: NEGATIVE mg/dL
Leukocytes,Ua: NEGATIVE
Nitrite: NEGATIVE
Protein, ur: NEGATIVE mg/dL
Specific Gravity, Urine: 1.006 (ref 1.005–1.030)
pH: 5 (ref 5.0–8.0)

## 2022-03-28 LAB — PREPARE RBC (CROSSMATCH)

## 2022-03-28 LAB — ABO/RH: ABO/RH(D): O POS

## 2022-03-28 MED ORDER — HYDROCODONE-ACETAMINOPHEN 5-325 MG PO TABS
1.0000 | ORAL_TABLET | ORAL | Status: DC | PRN
  Administered 2022-03-29 (×2): 2 via ORAL
  Filled 2022-03-28 (×3): qty 2

## 2022-03-28 MED ORDER — SODIUM CHLORIDE 0.9% IV SOLUTION: Freq: Once | INTRAVENOUS | Status: AC

## 2022-03-29 ENCOUNTER — Inpatient Hospital Stay (HOSPITAL_COMMUNITY): Payer: Federal, State, Local not specified - PPO

## 2022-03-29 ENCOUNTER — Other Ambulatory Visit: Payer: Self-pay

## 2022-03-29 ENCOUNTER — Other Ambulatory Visit (HOSPITAL_COMMUNITY): Payer: Federal, State, Local not specified - PPO

## 2022-03-29 LAB — BASIC METABOLIC PANEL
Anion gap: 8 (ref 5–15)
BUN: 13 mg/dL (ref 6–20)
CO2: 23 mmol/L (ref 22–32)
Calcium: 8.4 mg/dL — ABNORMAL LOW (ref 8.9–10.3)
Chloride: 104 mmol/L (ref 98–111)
Creatinine, Ser: 0.91 mg/dL (ref 0.44–1.00)
GFR, Estimated: >60 mL/min (ref >60)
Glucose, Bld: 135 mg/dL — ABNORMAL HIGH (ref 70–99)
Potassium: 3.6 mmol/L (ref 3.5–5.1)
Sodium: 135 mmol/L (ref 135–145)

## 2022-03-29 LAB — TYPE AND SCREEN
ABO/RH(D): O POS
Antibody Screen: NEGATIVE
Unit division: 0
Unit division: 0

## 2022-03-29 LAB — CBC
HCT: 34.5 % — ABNORMAL LOW (ref 36.0–46.0)
HCT: 31.5 % — ABNORMAL LOW (ref 36.0–46.0)
Hemoglobin: 11 g/dL — ABNORMAL LOW (ref 12.0–15.0)
Hemoglobin: 10.5 g/dL — ABNORMAL LOW (ref 12.0–15.0)
MCH: 26.4 pg (ref 26.0–34.0)
MCH: 27.7 pg (ref 26.0–34.0)
MCHC: 31.9 g/dL (ref 30.0–36.0)
MCHC: 33.3 g/dL (ref 30.0–36.0)
MCV: 82.7 fL (ref 80.0–100.0)
MCV: 83.1 fL (ref 80.0–100.0)
Platelets: 347 10*3/uL (ref 150–400)
Platelets: 321 10*3/uL (ref 150–400)
RBC: 4.17 MIL/uL (ref 3.87–5.11)
RBC: 3.79 MIL/uL — ABNORMAL LOW (ref 3.87–5.11)
RDW: 18.3 % — ABNORMAL HIGH (ref 11.5–15.5)
RDW: 18.6 % — ABNORMAL HIGH (ref 11.5–15.5)
WBC: 9.3 10*3/uL (ref 4.0–10.5)
WBC: 8.7 10*3/uL (ref 4.0–10.5)
nRBC: 0 % (ref 0.0–0.2)
nRBC: 0 % (ref 0.0–0.2)

## 2022-03-29 LAB — URINALYSIS, ROUTINE W REFLEX MICROSCOPIC
Bilirubin Urine: NEGATIVE
Glucose, UA: NEGATIVE mg/dL
Hgb urine dipstick: NEGATIVE
Ketones, ur: NEGATIVE mg/dL
Leukocytes,Ua: NEGATIVE
Nitrite: NEGATIVE
Protein, ur: NEGATIVE mg/dL
Specific Gravity, Urine: 1.008 (ref 1.005–1.030)
pH: 6 (ref 5.0–8.0)

## 2022-03-29 LAB — BPAM RBC
Blood Product Expiration Date: 202404062359
Blood Product Expiration Date: 202404072359
ISSUE DATE / TIME: 202403101432
ISSUE DATE / TIME: 202403101700
Unit Type and Rh: 5100
Unit Type and Rh: 5100

## 2022-03-29 MED ORDER — DIPHENHYDRAMINE HCL 25 MG PO CAPS
25.0000 mg | ORAL_CAPSULE | Freq: Four times a day (QID) | ORAL | Status: DC | PRN
  Administered 2022-03-29: 25 mg via ORAL
  Filled 2022-03-29: qty 1

## 2022-03-29 MED ORDER — HYDROMORPHONE HCL 1 MG/ML IJ SOLN: 0.5000 mg | INTRAMUSCULAR | Status: DC | PRN

## 2022-03-29 MED ORDER — LIDOCAINE 5 % EX PTCH
2.0000 | MEDICATED_PATCH | CUTANEOUS | Status: DC
  Administered 2022-03-29 – 2022-03-30 (×2): 2 via TRANSDERMAL
  Filled 2022-03-29 (×2): qty 2

## 2022-03-29 MED ORDER — PHENAZOPYRIDINE HCL 100 MG PO TABS
100.0000 mg | ORAL_TABLET | Freq: Three times a day (TID) | ORAL | Status: DC
  Administered 2022-03-29 – 2022-03-30 (×4): 100 mg via ORAL
  Filled 2022-03-29 (×5): qty 1

## 2022-03-29 MED ORDER — KETOROLAC TROMETHAMINE 15 MG/ML IJ SOLN
30.0000 mg | Freq: Four times a day (QID) | INTRAMUSCULAR | Status: DC
  Administered 2022-03-29 – 2022-03-30 (×5): 30 mg via INTRAVENOUS
  Filled 2022-03-29 (×5): qty 2

## 2022-03-29 MED ORDER — METHOCARBAMOL 500 MG PO TABS
1000.0000 mg | ORAL_TABLET | Freq: Three times a day (TID) | ORAL | Status: DC
  Administered 2022-03-29 – 2022-03-30 (×3): 1000 mg via ORAL
  Filled 2022-03-29 (×4): qty 2

## 2022-03-29 MED ORDER — TAMSULOSIN HCL 0.4 MG PO CAPS
0.4000 mg | ORAL_CAPSULE | Freq: Every day | ORAL | Status: DC
  Administered 2022-03-29 – 2022-03-30 (×2): 0.4 mg via ORAL
  Filled 2022-03-29 (×2): qty 1

## 2022-03-29 MED ORDER — PHENAZOPYRIDINE HCL 100 MG PO TABS
100.0000 mg | ORAL_TABLET | Freq: Three times a day (TID) | ORAL | Status: DC
  Filled 2022-03-29: qty 1

## 2022-03-29 MED ORDER — ACETAMINOPHEN 500 MG PO TABS
1000.0000 mg | ORAL_TABLET | Freq: Four times a day (QID) | ORAL | Status: DC
  Administered 2022-03-29 – 2022-03-30 (×5): 1000 mg via ORAL
  Filled 2022-03-29 (×4): qty 2

## 2022-03-29 MED ORDER — OXYCODONE HCL 5 MG PO TABS: 5.0000 mg | ORAL_TABLET | ORAL | Status: DC | PRN

## 2022-03-29 MED ORDER — CHLORHEXIDINE GLUCONATE CLOTH 2 % EX PADS
6.0000 | MEDICATED_PAD | Freq: Every day | CUTANEOUS | Status: DC
  Administered 2022-03-29 – 2022-03-30 (×2): 6 via TOPICAL

## 2022-03-29 MED ORDER — HYDROMORPHONE HCL 1 MG/ML IJ SOLN
0.5000 mg | INTRAMUSCULAR | Status: DC | PRN
  Administered 2022-03-30: 0.5 mg via INTRAVENOUS
  Filled 2022-03-29: qty 0.5

## 2022-03-29 MED ORDER — SPIRITUS FRUMENTI: 1.0000 | Freq: Two times a day (BID) | ORAL | Status: DC

## 2022-03-29 MED ORDER — OXYCODONE HCL 5 MG PO TABS
5.0000 mg | ORAL_TABLET | ORAL | Status: DC | PRN
  Administered 2022-03-29 – 2022-03-30 (×2): 10 mg via ORAL
  Filled 2022-03-29 (×2): qty 2

## 2022-03-29 MED ORDER — SPIRITUS FRUMENTI
1.0000 | Freq: Three times a day (TID) | ORAL | Status: DC
  Administered 2022-03-29 (×2): 1 via ORAL
  Filled 2022-03-29 (×4): qty 1

## 2022-03-29 MED ORDER — LORAZEPAM 2 MG/ML PO CONC: 1.0000 mg | ORAL | Status: DC | PRN

## 2022-03-29 MED ORDER — BUSPIRONE HCL 5 MG PO TABS
7.5000 mg | ORAL_TABLET | Freq: Two times a day (BID) | ORAL | Status: DC
  Administered 2022-03-29 – 2022-03-30 (×3): 7.5 mg via ORAL
  Filled 2022-03-29 (×3): qty 2

## 2022-03-29 MED ORDER — NICOTINE 21 MG/24HR TD PT24
21.0000 mg | MEDICATED_PATCH | Freq: Every day | TRANSDERMAL | Status: DC
  Administered 2022-03-29 – 2022-03-30 (×2): 21 mg via TRANSDERMAL
  Filled 2022-03-29 (×2): qty 1

## 2022-03-30 ENCOUNTER — Inpatient Hospital Stay (HOSPITAL_COMMUNITY): Payer: Federal, State, Local not specified - PPO

## 2022-03-30 ENCOUNTER — Encounter (HOSPITAL_COMMUNITY): Payer: Self-pay | Admitting: Emergency Medicine

## 2022-03-30 LAB — CBC
HCT: 33.5 % — ABNORMAL LOW (ref 36.0–46.0)
Hemoglobin: 11 g/dL — ABNORMAL LOW (ref 12.0–15.0)
MCH: 26.9 pg (ref 26.0–34.0)
MCHC: 32.8 g/dL (ref 30.0–36.0)
MCV: 81.9 fL (ref 80.0–100.0)
Platelets: 364 10*3/uL (ref 150–400)
RBC: 4.09 MIL/uL (ref 3.87–5.11)
RDW: 18.6 % — ABNORMAL HIGH (ref 11.5–15.5)
WBC: 8.5 10*3/uL (ref 4.0–10.5)
nRBC: 0 % (ref 0.0–0.2)

## 2022-03-30 MED ORDER — TAMSULOSIN HCL 0.4 MG PO CAPS: 0.4000 mg | ORAL_CAPSULE | Freq: Every day | ORAL | 0 refills | Status: AC

## 2022-03-30 MED ORDER — OXYCODONE HCL 5 MG PO TABS: 5.0000 mg | ORAL_TABLET | ORAL | 0 refills | Status: AC | PRN

## 2022-03-30 MED ORDER — SPIRITUS FRUMENTI
1.0000 | Freq: Three times a day (TID) | ORAL | Status: DC
  Administered 2022-03-30: 1 via ORAL
  Filled 2022-03-30 (×4): qty 1

## 2022-03-30 MED ORDER — ACETAMINOPHEN 500 MG PO TABS: 1000.0000 mg | ORAL_TABLET | Freq: Four times a day (QID) | ORAL | 0 refills | Status: AC | PRN

## 2022-03-30 MED ORDER — METHOCARBAMOL 500 MG PO TABS: 500.0000 mg | ORAL_TABLET | Freq: Four times a day (QID) | ORAL | 0 refills | Status: AC | PRN

## 2022-03-30 NOTE — Progress Notes (Signed)
Patient discharging home with sister and husband. Vitals signs stable at time of discharge  as reflecting in discharge summary. Discharge instructions given and verbal understanding returned. No questions at this time.
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
